# Patient Record
Sex: Male | Born: 1945 | Race: Black or African American | Hispanic: No | Marital: Married | Smoking: Never smoker
Health system: Southern US, Community
[De-identification: ages and names within clinical notes are randomized; demographics above are authoritative.]

## PROBLEM LIST (undated history)

## (undated) DIAGNOSIS — Z8711 Personal history of peptic ulcer disease: Secondary | ICD-10-CM

## (undated) DIAGNOSIS — I639 Cerebral infarction, unspecified: Secondary | ICD-10-CM

## (undated) DIAGNOSIS — J189 Pneumonia, unspecified organism: Secondary | ICD-10-CM

## (undated) DIAGNOSIS — M1990 Unspecified osteoarthritis, unspecified site: Secondary | ICD-10-CM

## (undated) DIAGNOSIS — I739 Peripheral vascular disease, unspecified: Secondary | ICD-10-CM

## (undated) DIAGNOSIS — Z8719 Personal history of other diseases of the digestive system: Secondary | ICD-10-CM

## (undated) DIAGNOSIS — R51 Headache: Secondary | ICD-10-CM

## (undated) DIAGNOSIS — I10 Essential (primary) hypertension: Secondary | ICD-10-CM

## (undated) DIAGNOSIS — E669 Obesity, unspecified: Secondary | ICD-10-CM

## (undated) DIAGNOSIS — E119 Type 2 diabetes mellitus without complications: Secondary | ICD-10-CM

## (undated) DIAGNOSIS — I4891 Unspecified atrial fibrillation: Secondary | ICD-10-CM

## (undated) DIAGNOSIS — E785 Hyperlipidemia, unspecified: Secondary | ICD-10-CM

## (undated) DIAGNOSIS — M545 Low back pain, unspecified: Secondary | ICD-10-CM

## (undated) DIAGNOSIS — K219 Gastro-esophageal reflux disease without esophagitis: Secondary | ICD-10-CM

## (undated) DIAGNOSIS — G8929 Other chronic pain: Secondary | ICD-10-CM

## (undated) HISTORY — PX: EXCISIONAL HEMORRHOIDECTOMY: SHX1541

## (undated) HISTORY — PX: APPENDECTOMY: SHX54

---
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
## 1969-06-24 HISTORY — PX: INCISION AND DRAINAGE OF WOUND: SHX1803

## 2011-10-25 HISTORY — PX: MULTIPLE TOOTH EXTRACTIONS: SHX2053

## 2013-02-26 ENCOUNTER — Telehealth (HOSPITAL_COMMUNITY): Payer: Self-pay | Admitting: *Deleted

## 2013-02-27 ENCOUNTER — Other Ambulatory Visit (HOSPITAL_COMMUNITY): Payer: Self-pay | Admitting: Internal Medicine

## 2013-02-27 DIAGNOSIS — I509 Heart failure, unspecified: Secondary | ICD-10-CM

## 2013-02-27 DIAGNOSIS — R609 Edema, unspecified: Secondary | ICD-10-CM

## 2013-03-03 ENCOUNTER — Emergency Department (HOSPITAL_COMMUNITY): Payer: Medicare Other

## 2013-03-03 ENCOUNTER — Encounter (HOSPITAL_COMMUNITY): Payer: Self-pay | Admitting: *Deleted

## 2013-03-03 ENCOUNTER — Observation Stay (HOSPITAL_COMMUNITY)
Admission: EM | Admit: 2013-03-03 | Discharge: 2013-03-04 | Disposition: A | Discharge status: Home / Self Care | Payer: Medicare Other | Attending: Internal Medicine | Admitting: Internal Medicine

## 2013-03-03 DIAGNOSIS — L02419 Cutaneous abscess of limb, unspecified: Secondary | ICD-10-CM | POA: Diagnosis not present

## 2013-03-03 DIAGNOSIS — E1165 Type 2 diabetes mellitus with hyperglycemia: Secondary | ICD-10-CM | POA: Diagnosis present

## 2013-03-03 DIAGNOSIS — L089 Local infection of the skin and subcutaneous tissue, unspecified: Secondary | ICD-10-CM

## 2013-03-03 DIAGNOSIS — L0390 Cellulitis, unspecified: Secondary | ICD-10-CM

## 2013-03-03 DIAGNOSIS — L0291 Cutaneous abscess, unspecified: Secondary | ICD-10-CM | POA: Diagnosis present

## 2013-03-03 DIAGNOSIS — I10 Essential (primary) hypertension: Secondary | ICD-10-CM | POA: Insufficient documentation

## 2013-03-03 DIAGNOSIS — L97309 Non-pressure chronic ulcer of unspecified ankle with unspecified severity: Secondary | ICD-10-CM

## 2013-03-03 DIAGNOSIS — E119 Type 2 diabetes mellitus without complications: Secondary | ICD-10-CM | POA: Diagnosis not present

## 2013-03-03 DIAGNOSIS — N390 Urinary tract infection, site not specified: Secondary | ICD-10-CM | POA: Insufficient documentation

## 2013-03-03 DIAGNOSIS — E669 Obesity, unspecified: Secondary | ICD-10-CM | POA: Diagnosis present

## 2013-03-03 DIAGNOSIS — T148XXA Other injury of unspecified body region, initial encounter: Secondary | ICD-10-CM

## 2013-03-03 DIAGNOSIS — T798XXA Other early complications of trauma, initial encounter: Secondary | ICD-10-CM

## 2013-03-03 HISTORY — DX: Low back pain, unspecified: M54.50

## 2013-03-03 HISTORY — DX: Low back pain: M54.5

## 2013-03-03 HISTORY — DX: Unspecified atrial fibrillation: I48.91

## 2013-03-03 HISTORY — DX: Personal history of peptic ulcer disease: Z87.11

## 2013-03-03 HISTORY — DX: Peripheral vascular disease, unspecified: I73.9

## 2013-03-03 HISTORY — DX: Essential (primary) hypertension: I10

## 2013-03-03 HISTORY — DX: Unspecified osteoarthritis, unspecified site: M19.90

## 2013-03-03 HISTORY — DX: Pneumonia, unspecified organism: J18.9

## 2013-03-03 HISTORY — DX: Headache: R51

## 2013-03-03 HISTORY — DX: Other chronic pain: G89.29

## 2013-03-03 HISTORY — DX: Obesity, unspecified: E66.9

## 2013-03-03 HISTORY — DX: Type 2 diabetes mellitus without complications: E11.9

## 2013-03-03 HISTORY — DX: Personal history of other diseases of the digestive system: Z87.19

## 2013-03-03 HISTORY — DX: Gastro-esophageal reflux disease without esophagitis: K21.9

## 2013-03-03 LAB — GLUCOSE, CAPILLARY
Glucose-Capillary: 172 mg/dL — ABNORMAL HIGH (ref 70–99)
Glucose-Capillary: 145 mg/dL — ABNORMAL HIGH (ref 70–99)

## 2013-03-03 LAB — BASIC METABOLIC PANEL
Calcium: 9.3 mg/dL (ref 8.4–10.5)
Chloride: 102 mEq/L (ref 96–112)
Creatinine, Ser: 0.81 mg/dL (ref 0.50–1.35)
GFR calc Af Amer: >90 mL/min (ref >90)

## 2013-03-03 LAB — VITAMIN B12: Vitamin B-12: 399 pg/mL (ref 211–911)

## 2013-03-03 LAB — CBC WITH DIFFERENTIAL/PLATELET
Basophils Absolute: 0 10*3/uL (ref 0.0–0.1)
HCT: 39.2 % (ref 39.0–52.0)
Lymphocytes Relative: 45 % (ref 12–46)
Neutro Abs: 3.6 10*3/uL (ref 1.7–7.7)
Platelets: 192 10*3/uL (ref 150–400)
RDW: 14.5 % (ref 11.5–15.5)
WBC: 8.1 10*3/uL (ref 4.0–10.5)

## 2013-03-03 LAB — URINALYSIS, ROUTINE W REFLEX MICROSCOPIC
Bilirubin Urine: NEGATIVE
Ketones, ur: NEGATIVE mg/dL
Nitrite: POSITIVE — AB
Protein, ur: NEGATIVE mg/dL
Urobilinogen, UA: 0.2 mg/dL (ref 0.0–1.0)

## 2013-03-03 LAB — CBC
MCH: 25.3 pg — ABNORMAL LOW (ref 26.0–34.0)
MCHC: 32.5 g/dL (ref 30.0–36.0)
MCV: 77.9 fL — ABNORMAL LOW (ref 78.0–100.0)
Platelets: 167 10*3/uL (ref 150–400)
RDW: 14.3 % (ref 11.5–15.5)

## 2013-03-03 LAB — IRON AND TIBC
Iron: 65 ug/dL (ref 42–135)
Saturation Ratios: 25 % (ref 20–55)
UIBC: 196 ug/dL (ref 125–400)

## 2013-03-03 LAB — URINE MICROSCOPIC-ADD ON

## 2013-03-03 LAB — LACTIC ACID, PLASMA: Lactic Acid, Venous: 1.3 mmol/L (ref 0.5–2.2)

## 2013-03-03 LAB — FOLATE: Folate: 15.3 ng/mL

## 2013-03-03 MED ORDER — INSULIN NPH (HUMAN) (ISOPHANE) 100 UNIT/ML ~~LOC~~ SUSP
10.0000 [IU] | Freq: Every day | SUBCUTANEOUS | Status: DC
  Administered 2013-03-04: 10 [IU] via SUBCUTANEOUS
  Filled 2013-03-03: qty 10

## 2013-03-03 MED ORDER — ONDANSETRON HCL 4 MG/2ML IJ SOLN: 4.0000 mg | Freq: Four times a day (QID) | INTRAMUSCULAR | Status: DC | PRN

## 2013-03-03 MED ORDER — INSULIN NPH (HUMAN) (ISOPHANE) 100 UNIT/ML ~~LOC~~ SUSP
10.0000 [IU] | Freq: Two times a day (BID) | SUBCUTANEOUS | Status: DC
  Filled 2013-03-03: qty 10

## 2013-03-03 MED ORDER — ENOXAPARIN SODIUM 40 MG/0.4ML ~~LOC~~ SOLN: 40.0000 mg | SUBCUTANEOUS | Status: DC

## 2013-03-03 MED ORDER — OXYCODONE HCL 5 MG PO TABS
5.0000 mg | ORAL_TABLET | ORAL | Status: DC | PRN
  Administered 2013-03-03 – 2013-03-04 (×3): 5 mg via ORAL
  Filled 2013-03-03 (×4): qty 1

## 2013-03-03 MED ORDER — INSULIN NPH (HUMAN) (ISOPHANE) 100 UNIT/ML ~~LOC~~ SUSP
10.0000 [IU] | Freq: Every day | SUBCUTANEOUS | Status: DC
  Administered 2013-03-03: 10 [IU] via SUBCUTANEOUS
  Filled 2013-03-03 (×2): qty 10

## 2013-03-03 MED ORDER — ZOLPIDEM TARTRATE 5 MG PO TABS: 5.0000 mg | ORAL_TABLET | Freq: Every evening | ORAL | Status: DC | PRN

## 2013-03-03 MED ORDER — INSULIN ASPART 100 UNIT/ML ~~LOC~~ SOLN
0.0000 [IU] | SUBCUTANEOUS | Status: DC
  Administered 2013-03-03: 2 [IU] via SUBCUTANEOUS

## 2013-03-03 MED ORDER — ENOXAPARIN SODIUM 80 MG/0.8ML ~~LOC~~ SOLN
75.0000 mg | SUBCUTANEOUS | Status: DC
  Administered 2013-03-04: 75 mg via SUBCUTANEOUS
  Filled 2013-03-03 (×2): qty 0.8

## 2013-03-03 MED ORDER — ACETAMINOPHEN 650 MG RE SUPP: 650.0000 mg | Freq: Four times a day (QID) | RECTAL | Status: DC | PRN

## 2013-03-03 MED ORDER — ONDANSETRON HCL 4 MG PO TABS: 4.0000 mg | ORAL_TABLET | Freq: Four times a day (QID) | ORAL | Status: DC | PRN

## 2013-03-03 MED ORDER — PIPERACILLIN-TAZOBACTAM 3.375 G IVPB
3.3750 g | Freq: Three times a day (TID) | INTRAVENOUS | Status: DC
  Administered 2013-03-03 – 2013-03-04 (×4): 3.375 g via INTRAVENOUS
  Filled 2013-03-03 (×7): qty 50

## 2013-03-03 MED ORDER — ALUM & MAG HYDROXIDE-SIMETH 200-200-20 MG/5ML PO SUSP: 30.0000 mL | Freq: Four times a day (QID) | ORAL | Status: DC | PRN

## 2013-03-03 MED ORDER — ACETAMINOPHEN 325 MG PO TABS
650.0000 mg | ORAL_TABLET | Freq: Four times a day (QID) | ORAL | Status: DC | PRN
  Administered 2013-03-03: 650 mg via ORAL
  Filled 2013-03-03: qty 2

## 2013-03-03 MED ORDER — MORPHINE SULFATE 4 MG/ML IJ SOLN
4.0000 mg | Freq: Once | INTRAMUSCULAR | Status: AC
  Administered 2013-03-03: 4 mg via INTRAVENOUS
  Filled 2013-03-03: qty 1

## 2013-03-03 MED ORDER — FENTANYL CITRATE 0.05 MG/ML IJ SOLN: 50.0000 ug | INTRAMUSCULAR | Status: DC | PRN

## 2013-03-03 MED ORDER — PIPERACILLIN-TAZOBACTAM 4.5 G IVPB
4.5000 g | Freq: Once | INTRAVENOUS | Status: AC
  Administered 2013-03-03: 4.5 g via INTRAVENOUS
  Filled 2013-03-03: qty 100

## 2013-03-03 MED ORDER — VANCOMYCIN HCL 10 G IV SOLR
1500.0000 mg | Freq: Two times a day (BID) | INTRAVENOUS | Status: DC
  Administered 2013-03-03 – 2013-03-04 (×3): 1500 mg via INTRAVENOUS
  Filled 2013-03-03 (×6): qty 1500

## 2013-03-03 MED ORDER — HYDROMORPHONE HCL PF 1 MG/ML IJ SOLN
0.5000 mg | INTRAMUSCULAR | Status: DC | PRN
  Filled 2013-03-03: qty 1

## 2013-03-03 MED ORDER — SODIUM CHLORIDE 0.9 % IV SOLN
INTRAVENOUS | Status: DC
  Administered 2013-03-03: 07:00:00 via INTRAVENOUS

## 2013-03-03 MED ORDER — INSULIN NPH (HUMAN) (ISOPHANE) 100 UNIT/ML ~~LOC~~ SUSP
20.0000 [IU] | Freq: Two times a day (BID) | SUBCUTANEOUS | Status: DC
  Filled 2013-03-03: qty 10

## 2013-03-03 MED ORDER — INSULIN ASPART 100 UNIT/ML ~~LOC~~ SOLN
0.0000 [IU] | Freq: Three times a day (TID) | SUBCUTANEOUS | Status: DC
  Administered 2013-03-03 – 2013-03-04 (×2): 2 [IU] via SUBCUTANEOUS
  Administered 2013-03-04: 1 [IU] via SUBCUTANEOUS

## 2013-03-03 MED ORDER — FENTANYL CITRATE 0.05 MG/ML IJ SOLN
25.0000 ug | INTRAMUSCULAR | Status: AC | PRN
  Administered 2013-03-03 (×2): 25 ug via INTRAVENOUS
  Filled 2013-03-03 (×2): qty 2

## 2013-03-03 MED ORDER — VANCOMYCIN HCL IN DEXTROSE 1-5 GM/200ML-% IV SOLN
1000.0000 mg | Freq: Once | INTRAVENOUS | Status: AC
  Administered 2013-03-03: 1000 mg via INTRAVENOUS
  Filled 2013-03-03: qty 200

## 2013-03-03 MED ORDER — MORPHINE SULFATE 4 MG/ML IJ SOLN: 4.0000 mg | INTRAMUSCULAR | Status: DC | PRN

## 2013-03-03 MED ORDER — PIPERACILLIN-TAZOBACTAM 3.375 G IVPB: 3.3750 g | Freq: Four times a day (QID) | INTRAVENOUS | Status: DC

## 2013-03-03 NOTE — Progress Notes (Signed)
Patient stated he received 70units of Novolin insulin twice daily.  However he restated he injected 70/30 insulin 70units twice a day.  Then he stated 70 units in the morning, then 32 units in the afternoon.  When continuing to question patient about checking his CBG's, patient stated he checked them five times and day, then stated he needed a new machine, his was broken.  When nurse continued to question patient about his SSI and how much he was receiving d/t broken meter, patient became very agitated and said there was a lack of communication around here.  Patient then admitted he has not taken any insulin since feb 2014 and we should have all of his records on file.  If not, the [place] in [place].  MD notified of patients non-compliance and contradiction in all of his stories, including his ulcers on his left ankle.  Nsg to continue to monitor for status changes.

## 2013-03-03 NOTE — ED Provider Notes (Signed)
History     CSN: [id number]  Arrival date & time 03/03/13  0208   First MD Initiated Contact with Patient 03/03/13 0214      Chief Complaint  Patient presents with  . Leg Ulcers      HPI Pt was seen at 0230.  Per pt, c/o gradual onset and worsening of persistent left ankle ulcers "on and off since 2009," worse over the past several days.  Pt states he noticed "white stuff" on the ulcers and increasing pain at the ulcerated areas for the past few days.  Pt states he was eval by his PMD 1 week ago for same, "and he gave me a bunch of referrals to other doctors to get checked out."  Denies new injury, no fevers, no focal motor weakness, no tingling/numbness in extremities.     Past Medical History  Diagnosis Date  . Diabetes mellitus without complication   . Hypertension    States he is uncircumcised  History reviewed. No pertinent past surgical history.    History  Substance Use Topics  . Smoking status: Never Smoker   . Smokeless tobacco: Not on file  . Alcohol Use: No      Review of Systems ROS: Statement: All systems negative except as marked or noted in the HPI; Constitutional: Negative for fever and chills. ; ; Eyes: Negative for eye pain, redness and discharge. ; ; ENMT: Negative for ear pain, hoarseness, nasal congestion, sinus pressure and sore throat. ; ; Cardiovascular: Negative for chest pain, palpitations, diaphoresis, dyspnea and peripheral edema. ; ; Respiratory: Negative for cough, wheezing and stridor. ; ; Gastrointestinal: Negative for nausea, vomiting, diarrhea, abdominal pain, blood in stool, hematemesis, jaundice and rectal bleeding. . ; ; Genitourinary: Negative for dysuria, flank pain and hematuria. ; ; Musculoskeletal: Negative for back pain and neck pain. Negative for swelling and trauma.; ; Skin: +left ankle ulcers.  Negative for pruritus, rash, abrasions, blisters, bruising.; ; Neuro: Negative for headache, lightheadedness and neck stiffness. Negative for  weakness, altered level of consciousness , altered mental status, extremity weakness, paresthesias, involuntary movement, seizure and syncope.      Allergies  Ibuprofen  Home Medications   Current Outpatient Rx  Name  Route  Sig  Dispense  Refill  . acetaminophen (TYLENOL) 500 MG tablet   Oral   Take 500 mg by mouth every 6 (six) hours as needed for pain.           BP 145/80  Pulse 57  Temp(Src) 98.5 F (36.9 C) (Oral)  Resp 16  SpO2 97%  Physical Exam 0235: Physical examination:  Nursing notes reviewed; Vital signs and O2 SAT reviewed;  Constitutional: Well developed, Well nourished, Well hydrated, In no acute distress; Head:  Normocephalic, atraumatic; Eyes: EOMI, PERRL, No scleral icterus; ENMT: Mouth and pharynx normal, Mucous membranes moist; Neck: Supple, Full range of motion, No lymphadenopathy; Cardiovascular: Regular rate and rhythm, No murmur, rub, or gallop; Respiratory: Breath sounds clear & equal bilaterally, No rales, rhonchi, wheezes.  Speaking full sentences with ease, Normal respiratory effort/excursion; Chest: Nontender, Movement normal; Abdomen: Soft, Nontender, Nondistended, Normal bowel sounds; Genitourinary: No CVA tenderness; Extremities: Pulses normal, No tenderness, +1 pedal edema bilat without calf asymmetry. +approx 8cm diameter ulcers with large amount of overlying maggots left medial and lateral malleolar areas.; Neuro: AA&Ox3, Major CN grossly intact.  Speech clear. Moves all extremities without apparent gross focal motor deficits.; Skin: Color normal, Warm, Dry.   ED Course  Procedures  MDM  MDM Reviewed: nursing note and vitals Interpretation: labs and x-ray   Results for orders placed during the hospital encounter of 03/03/13  URINALYSIS, ROUTINE W REFLEX MICROSCOPIC      Result Value Range   Color, Urine YELLOW  YELLOW   APPearance CLOUDY (*) CLEAR   Specific Gravity, Urine 1.020  1.005 - 1.030   pH 5.0  5.0 - 8.0   Glucose, UA  NEGATIVE  NEGATIVE mg/dL   Hgb urine dipstick TRACE (*) NEGATIVE   Bilirubin Urine NEGATIVE  NEGATIVE   Ketones, ur NEGATIVE  NEGATIVE mg/dL   Protein, ur NEGATIVE  NEGATIVE mg/dL   Urobilinogen, UA 0.2  0.0 - 1.0 mg/dL   Nitrite POSITIVE (*) NEGATIVE   Leukocytes, UA MODERATE (*) NEGATIVE  BASIC METABOLIC PANEL      Result Value Range   Sodium 138  135 - 145 mEq/L   Potassium 4.2  3.5 - 5.1 mEq/L   Chloride 102  96 - 112 mEq/L   CO2 25  19 - 32 mEq/L   Glucose, Bld 180 (*) 70 - 99 mg/dL   BUN 25 (*) 6 - 23 mg/dL   Creatinine, Ser 1.61  0.50 - 1.35 mg/dL   Calcium 9.3  8.4 - 09.6 mg/dL   GFR calc non Af Amer 90 (*) >90 mL/min   GFR calc Af Amer >90  >90 mL/min  CBC WITH DIFFERENTIAL      Result Value Range   WBC 8.1  4.0 - 10.5 K/uL   RBC 5.00  4.22 - 5.81 MIL/uL   Hemoglobin 12.9 (*) 13.0 - 17.0 g/dL   HCT 04.5  40.9 - 81.1 %   MCV 78.4  78.0 - 100.0 fL   MCH 25.8 (*) 26.0 - 34.0 pg   MCHC 32.9  30.0 - 36.0 g/dL   RDW 91.4  78.2 - 95.6 %   Platelets 192  150 - 400 K/uL   Neutrophils Relative 45  43 - 77 %   Neutro Abs 3.6  1.7 - 7.7 K/uL   Lymphocytes Relative 45  12 - 46 %   Lymphs Abs 3.6  0.7 - 4.0 K/uL   Monocytes Relative 8  3 - 12 %   Monocytes Absolute 0.6  0.1 - 1.0 K/uL   Eosinophils Relative 3  0 - 5 %   Eosinophils Absolute 0.2  0.0 - 0.7 K/uL   Basophils Relative 0  0 - 1 %   Basophils Absolute 0.0  0.0 - 0.1 K/uL  LACTIC ACID, PLASMA      Result Value Range   Lactic Acid, Venous 1.3  0.5 - 2.2 mmol/L  URINE MICROSCOPIC-ADD ON      Result Value Range   Squamous Epithelial / LPF FEW (*) RARE   WBC, UA 11-20  <3 WBC/hpf   RBC / HPF 0-2  <3 RBC/hpf   Bacteria, UA MANY (*) RARE   Dg Ankle Left Port 03/03/2013  *RADIOLOGY REPORT*  Clinical Data: Skin ulcerations.  PORTABLE LEFT ANKLE - 2 VIEW  Comparison: None.  Findings: No bony destructive change, soft tissue gas collection, fracture or dislocation is identified.  No radiopaque foreign body is seen with  skin calcifications noted.  IMPRESSION: No acute bony or joint abnormality.   Original Report Authenticated By: Holley Dexter, M.D.     0330: T/C to Ortho Dr. Lequita Halt, case discussed, including:  HPI, pertinent PM/SHx, VS/PE, dx testing, ED course and treatment:  States to wash maggots off with  NS, cover with NS dressing, have medicine admit team call Ortho MD for consult prn.   0420:  Wounds cleaned with NS: appear down to fascia. +UTI, UC pending. Will start IV vancomycin and zosyn. T/C to [facility] Dr. Lovell Sheehan, case discussed, including:  HPI, pertinent PM/SHx, VS/PE, dx testing, ED course and treatment:  Agreeable to admit, requests to write temporary orders, obtain medical bed to team 10.          Laray Anger, DO 03/05/13 [phone]

## 2013-03-03 NOTE — Progress Notes (Signed)
Patient put on orange contact precautions d/t open wound with maggots. MD aware.

## 2013-03-03 NOTE — Progress Notes (Signed)
ANTIBIOTIC CONSULT NOTE - INITIAL  Pharmacy Consult for Vancomycin Indication: Left ankle Wound Infection    Allergies  Allergen Reactions  . Ibuprofen Other (See Comments)    unknown    Patient Measurements: Height: 6\' 3"  (190.5 cm) Weight: 330 lb (149.687 kg) IBW/kg (Calculated) : 84.5  Vital Signs: Temp: 97.9 F (36.6 C) (05/11 0649) Temp src: Oral (05/11 0649) BP: 137/83 mmHg (05/11 0630) Pulse Rate: 62 (05/11 0630) Intake/Output from previous day:   Intake/Output from this shift:    Labs:  Recent Labs  03/03/13 0300  WBC 8.1  HGB 12.9*  PLT 192  CREATININE 0.81   Estimated Creatinine Clearance: 138.4 ml/min (by C-G formula based on Cr of 0.81). No results found for this basename: VANCOTROUGH, VANCOPEAK, VANCORANDOM, GENTTROUGH, GENTPEAK, GENTRANDOM, TOBRATROUGH, TOBRAPEAK, TOBRARND, AMIKACINPEAK, AMIKACINTROU, AMIKACIN,  in the last 72 hours   Microbiology: No results found for this or any previous visit (from the past 720 hour(s)).  Medical History: Past Medical History  Diagnosis Date  . Diabetes mellitus without complication   . Hypertension   . Obesity    Assessment: 67 y.o. male with Type II Diabetes and a chronic Left ankle Ulcer since 2009 who presents to the ED with pain and drainage from the wound and the wound was found to be infested with maggots. Pharmacy is consulted to start vancomycin. Zosyn is also started per MD. Patient received a dose of vancomycin 1000mg  in the ED at 0510. Scr 0.81, est. crcl > 100 ml/min   Goal of Therapy:  Vancomycin trough level 15-20 mcg/ml  Plan:  - Give vancomycin 1500 mg x 1 to finish 2500 loading dose - Vancomycin 150mg  IV Q 12 start at 2000 - F/u renal function, check vancomycin trough after 3 - 5 doses - Adjust zosyn to 3.375g IV Q 8 hrs (4 hr infusion)  Bayard Hugger, PharmD, BCPS  Clinical Pharmacist  Pager: [phone]   03/03/2013,6:55 AM

## 2013-03-03 NOTE — ED Notes (Addendum)
This RN and Air traffic controller Irrigated pt's foot wounds with NS. Area cleaned, maggots removed. Unable to remove a very small number of maggots. Wet dressing with saline and gauze applied to both wounds. Pt tolerated procedure well. Pt in NAD at this time, c/o 10/10 pain. EDP made aware.

## 2013-03-03 NOTE — Progress Notes (Signed)
Pt refused wound care and dressing change tonight.  He stated that he will not worry about till morning.    Pt was offer by nurse to call if he changes his mind.                    Pt was

## 2013-03-03 NOTE — Consult Note (Signed)
Thayer Headings, MD Chief Complaint: Chronic right ankle ulceration History: Todd Vargas is a 67 y.o. male with Type II Diabetes and a chronic Left ankle Ulcer since 2009 who presents to the ED with complaints of drainage from the wound today and 10/10 pain. In the ED, the wound was found to be infested with maggots. He had been seen in the ED 1 week ago , but the wound was not infested at that time. He was evaluated in the ED and the EDP performed an plain film of there left ankle which was negative for signs of osteomyelitis. IV Vancomycin and Zosyn were started and the EDP called for an Orthopedics Consultation, and spoke to Dr. Despina Hick.   Past Medical History  Diagnosis Date  . Diabetes mellitus without complication   . Hypertension   . Obesity     Allergies  Allergen Reactions  . Ibuprofen Other (See Comments)    unknown    No current facility-administered medications on file prior to encounter.   No current outpatient prescriptions on file prior to encounter.    Physical Exam: Filed Vitals:   03/03/13 0741  BP: 145/96  Pulse: 56  Temp: 98.2 F (36.8 C)  Resp: 18  A+O X3 No sob/cp abd - soft/nt Compartments soft/nt EHL/GA/TA grossly intact Sensation to LT decreased in the foot Decreased 1+ DP/PT pulses Wound: bimalleolar ankle ulcerations.  Superficial no exposed bone.  No active drainage.     Image: Dg Ankle Left Port  03/03/2013  *RADIOLOGY REPORT*  Clinical Data: Skin ulcerations.  PORTABLE LEFT ANKLE - 2 VIEW  Comparison: None.  Findings: No bony destructive change, soft tissue gas collection, fracture or dislocation is identified.  No radiopaque foreign body is seen with skin calcifications noted.  IMPRESSION: No acute bony or joint abnormality.   Original Report Authenticated By: Holley Dexter, M.D.     A/P:  Patient with long standing LE ulceration and wound issues.  Seen this AM in ER with bimalleolar ankle ulceration with maggots.  Wound cleaned and  dressed.  Admitted by medical service and ortho consult requested.  Xrays demonstrate no evidence of osteo.  Patient with cellulitis. Plan:  no acute ortho issue  Recommend medical management of cellulitis  Wound care center for wound management - will need to set up care following discharge.    Also recommend wound care nsg to see patient for further intervention   Will contact medical team.

## 2013-03-03 NOTE — Progress Notes (Signed)
[facility] PROGRESS NOTE  Todd Vargas XBJ:[id number] DOB: 1946-06-06 DOA: 03/03/2013 PCP: Thayer Headings, MD  Assessment/Plan: 1. Left ankle cellulitis and loss of skin integrity - with maggots coming out of the wound. - will get MRI to assure there is no deep abscess. Started on iv vancomycin and Zosyn on admission  2. DM 2 - nph and ssi novolog . A1C pending  3. HTN - probably BP up from pain   Antibiotics:  vanc and zosyn     HPI/Subjective: According to patient the wound is been in place for years. He has moved from [place] 5 months ago  Objective: Filed Vitals:   03/03/13 0600 03/03/13 0630 03/03/13 0649 03/03/13 0741  BP: 147/96 137/83  145/96  Pulse: 68 62  56  Temp:   97.9 F (36.6 C) 98.2 F (36.8 C)  TempSrc:   Oral   Resp:   16 18  Height:  6\' 3"  (1.905 m)    Weight:  149.687 kg (330 lb)    SpO2: 97% 98%  93%   Patient Vitals for the past 24 hrs:  BP Temp Temp src Pulse Resp SpO2 Height Weight  03/03/13 0741 145/96 mmHg 98.2 F (36.8 C) - 56 18 93 % - -  03/03/13 0649 - 97.9 F (36.6 C) Oral - 16 - - -  03/03/13 0630 137/83 mmHg - - 62 - 98 % 6\' 3"  (1.905 m) 149.687 kg (330 lb)  03/03/13 0600 147/96 mmHg - - 68 - 97 % - -  03/03/13 0530 138/87 mmHg - - 64 - 97 % - -  03/03/13 0500 145/80 mmHg - - 57 - 97 % - -  03/03/13 0445 145/90 mmHg - - 75 - 99 % - -  03/03/13 0430 152/86 mmHg - - 56 - 96 % - -  03/03/13 0415 155/89 mmHg - - 62 - 97 % - -  03/03/13 0400 135/102 mmHg - - 58 - 99 % - -  03/03/13 0345 130/102 mmHg - - 65 - 98 % - -  03/03/13 0336 139/84 mmHg 98.5 F (36.9 C) Oral 61 16 98 % - -    No intake or output data in the 24 hours ending 03/03/13 1207 Filed Weights   03/03/13 0630  Weight: 149.687 kg (330 lb)    Exam:   General:  axox3  Cardiovascular: rrr  Respiratory: ctab   Abdomen: soft, nt   Musculoskeletal: left ankle with open wound with maggots   Data Reviewed: Basic Metabolic Panel:  Recent Labs Lab  03/03/13 0300 03/03/13 0655  NA 138  --   K 4.2  --   CL 102  --   CO2 25  --   GLUCOSE 180*  --   BUN 25*  --   CREATININE 0.81 0.68  CALCIUM 9.3  --    Liver Function Tests: No results found for this basename: AST, ALT, ALKPHOS, BILITOT, PROT, ALBUMIN,  in the last 168 hours No results found for this basename: LIPASE, AMYLASE,  in the last 168 hours No results found for this basename: AMMONIA,  in the last 168 hours CBC:  Recent Labs Lab 03/03/13 0300 03/03/13 0655  WBC 8.1 7.3  NEUTROABS 3.6  --   HGB 12.9* 11.7*  HCT 39.2 36.0*  MCV 78.4 77.9*  PLT 192 167   Cardiac Enzymes: No results found for this basename: CKTOTAL, CKMB, CKMBINDEX, TROPONINI,  in the last 168 hours BNP (last 3 results) No results found for  this basename: PROBNP,  in the last 8760 hours CBG:  Recent Labs Lab 03/03/13 0753 03/03/13 1148  GLUCAP 172* 205*    No results found for this or any previous visit (from the past 240 hour(s)).   Studies: Dg Ankle Left Port  03/03/2013  *RADIOLOGY REPORT*  Clinical Data: Skin ulcerations.  PORTABLE LEFT ANKLE - 2 VIEW  Comparison: None.  Findings: No bony destructive change, soft tissue gas collection, fracture or dislocation is identified.  No radiopaque foreign body is seen with skin calcifications noted.  IMPRESSION: No acute bony or joint abnormality.   Original Report Authenticated By: Holley Dexter, M.D.     Scheduled Meds: . enoxaparin (LOVENOX) injection  75 mg Subcutaneous Q24H  . insulin aspart  0-9 Units Subcutaneous Q4H  . insulin NPH  20 Units Subcutaneous BID WC  . piperacillin-tazobactam (ZOSYN)  IV  3.375 g Intravenous Q8H  . vancomycin  1,500 mg Intravenous Q12H   Continuous Infusions: . sodium chloride 75 mL/hr at 03/03/13 0631    Principal Problem:   Cellulitis Active Problems:   Wound infection   Diabetes mellitus type 2, uncontrolled   Hypertension   Morbid obesity   UTI (lower urinary tract  infection)      Taletha Twiford  [facility] Pager [phone]. If 7PM-7AM, please contact night-coverage at [email or web address], password [facility] 03/03/2013, 12:07 PM  LOS: 0 days

## 2013-03-03 NOTE — H&P (Addendum)
[facility] History and Physical  Todd Vargas WGN:[id number] DOB: [birth year] DOA: 03/03/2013  Referring physician:  EDP PCP: Thayer Headings, MD  Specialists:   Chief Complaint:   HPI: Todd Vargas is a 67 y.o. male with Type II Diabetes and a chronic Left ankle Ulcer since 2009 who presents to the ED with complaints of drainage from the wound today and 10/10 pain.   In the ED, the wound was found to be infested with maggots.   He had been seen in the ED 1 week ago , but the wound was not infested at that time.   He was evaluated in the ED and the EDP performed an plain film of there left ankle which was negative for signs of osteomyelitis.  IV Vancomycin and Zosyn were started and the EDP called for an Orthopedics Consultation, and spoke to Dr. Despina Hick.      Review of Systems: The patient denies anorexia, fever, chills, headaches, weight loss, vision loss, decreased hearing, hoarseness, chest pain, syncope, dyspnea on exertion, peripheral edema, balance deficits, hemoptysis, abdominal pain, nausea, vomiting, diarrhea, constipation, hematemesis, melena, hematochezia, severe indigestion/heartburn, hematuria, incontinence, dysuria, muscle weakness, transient blindness, difficulty walking, depression, unusual weight change, abnormal bleeding, enlarged lymph nodes, angioedema, and breast masses.    Past Medical History  Diagnosis Date  . Diabetes mellitus without complication   . Hypertension   . Obesity    Past Surgical History  Procedure Laterality Date  . Tonsil    . Appendectomy    . Right eye      Medications:  HOME MEDS: Prior to Admission medications   Medication Sig Start Date End Date Taking? Authorizing Provider  acetaminophen (TYLENOL) 500 MG tablet Take 500 mg by mouth every 6 (six) hours as needed for pain.   Yes Historical Provider, MD    Allergies:  Allergies  Allergen Reactions  . Ibuprofen Other (See Comments)    unknown    Social History: Lives Alone  reports that he has never smoked. He does not have any smokeless tobacco history on file. He does not drink alcohol or use illicit drugs.   Family History: Family History  Problem Relation Age of Onset  . CAD Father   . CAD Mother   . Diabetes Mother     Physical Exam:  GEN: 64 year old Morbidly Obese African American Male examined  and in no acute distress; cooperative with exam Filed Vitals:   03/03/13 0415 03/03/13 0430 03/03/13 0445 03/03/13 0500  BP: 155/89 152/86 145/90 145/80  Pulse: 62 56 75 57  Temp:      TempSrc:      Resp:      SpO2: 97% 96% 99% 97%   Blood pressure 145/80, pulse 57, temperature 98.5 F (36.9 C), temperature source Oral, resp. rate 16, SpO2 97.00%. PSYCH: He is alert and oriented x4; does not appear anxious does not appear depressed; affect is normal HEENT: Normocephalic and Atraumatic, Mucous membranes pink; PERRLA; EOM intact; Fundi:  Benign;  No scleral icterus, Nares: Patent, Oropharynx: Clear, Fair Dentition, Neck:  FROM, no cervical lymphadenopathy nor thyromegaly or carotid bruit; no JVD; Breasts:: Not examined CHEST WALL: No tenderness CHEST: Normal respiration, clear to auscultation bilaterally HEART: Regular rate and rhythm; no murmurs rubs or gallops BACK: No kyphosis or scoliosis; no CVA tenderness ABDOMEN: Positive Bowel Sounds, Obese, soft non-tender; no masses, no organomegaly, no pannus; no intertriginous candida. Rectal Exam: Not done EXTREMITIES: No cyanosis, clubbing or 2+ EDEMA BLEs,  +Left Medial  lateral Malleolar ulcer annular with 7 cm diameter Genitalia: not examined PULSES: 2+ and symmetric SKIN: Normal hydration no rash or ulceration CNS: Cranial nerves 2-12 grossly intact no focal neurologic deficit   Labs & Imaging Results for orders placed during the hospital encounter of 03/03/13 (from the past 48 hour(s))  BASIC METABOLIC PANEL     Status: Abnormal   Collection Time    03/03/13  3:00 AM      Result Value Range    Sodium 138  135 - 145 mEq/L   Potassium 4.2  3.5 - 5.1 mEq/L   Chloride 102  96 - 112 mEq/L   CO2 25  19 - 32 mEq/L   Glucose, Bld 180 (*) 70 - 99 mg/dL   BUN 25 (*) 6 - 23 mg/dL   Creatinine, Ser 1.61  0.50 - 1.35 mg/dL   Calcium 9.3  8.4 - 09.6 mg/dL   GFR calc non Af Amer 90 (*) >90 mL/min   GFR calc Af Amer >90  >90 mL/min   Comment:            The eGFR has been calculated     using the CKD EPI equation.     This calculation has not been     validated in all clinical     situations.     eGFR's persistently     <90 mL/min signify     possible Chronic Kidney Disease.  CBC WITH DIFFERENTIAL     Status: Abnormal   Collection Time    03/03/13  3:00 AM      Result Value Range   WBC 8.1  4.0 - 10.5 K/uL   RBC 5.00  4.22 - 5.81 MIL/uL   Hemoglobin 12.9 (*) 13.0 - 17.0 g/dL   HCT 04.5  40.9 - 81.1 %   MCV 78.4  78.0 - 100.0 fL   MCH 25.8 (*) 26.0 - 34.0 pg   MCHC 32.9  30.0 - 36.0 g/dL   RDW 91.4  78.2 - 95.6 %   Platelets 192  150 - 400 K/uL   Neutrophils Relative 45  43 - 77 %   Neutro Abs 3.6  1.7 - 7.7 K/uL   Lymphocytes Relative 45  12 - 46 %   Lymphs Abs 3.6  0.7 - 4.0 K/uL   Monocytes Relative 8  3 - 12 %   Monocytes Absolute 0.6  0.1 - 1.0 K/uL   Eosinophils Relative 3  0 - 5 %   Eosinophils Absolute 0.2  0.0 - 0.7 K/uL   Basophils Relative 0  0 - 1 %   Basophils Absolute 0.0  0.0 - 0.1 K/uL  LACTIC ACID, PLASMA     Status: None   Collection Time    03/03/13  3:00 AM      Result Value Range   Lactic Acid, Venous 1.3  0.5 - 2.2 mmol/L  URINALYSIS, ROUTINE W REFLEX MICROSCOPIC     Status: Abnormal   Collection Time    03/03/13  4:04 AM      Result Value Range   Color, Urine YELLOW  YELLOW   APPearance CLOUDY (*) CLEAR   Specific Gravity, Urine 1.020  1.005 - 1.030   pH 5.0  5.0 - 8.0   Glucose, UA NEGATIVE  NEGATIVE mg/dL   Hgb urine dipstick TRACE (*) NEGATIVE   Bilirubin Urine NEGATIVE  NEGATIVE   Ketones, ur NEGATIVE  NEGATIVE mg/dL   Protein, ur  NEGATIVE  NEGATIVE mg/dL  Urobilinogen, UA 0.2  0.0 - 1.0 mg/dL   Nitrite POSITIVE (*) NEGATIVE   Leukocytes, UA MODERATE (*) NEGATIVE  URINE MICROSCOPIC-ADD ON     Status: Abnormal   Collection Time    03/03/13  4:04 AM      Result Value Range   Squamous Epithelial / LPF FEW (*) RARE   WBC, UA 11-20  <3 WBC/hpf   RBC / HPF 0-2  <3 RBC/hpf   Bacteria, UA MANY (*) RARE     Radiological Exams on Admission: Dg Ankle Left Port  03/03/2013  *RADIOLOGY REPORT*  Clinical Data: Skin ulcerations.  PORTABLE LEFT ANKLE - 2 VIEW  Comparison: None.  Findings: No bony destructive change, soft tissue gas collection, fracture or dislocation is identified.  No radiopaque foreign body is seen with skin calcifications noted.  IMPRESSION: No acute bony or joint abnormality.   Original Report Authenticated By: Holley Dexter, M.D.       Assessment/Plan Principal Problem:   Cellulitis Active Problems:   Wound infection   Diabetes mellitus type 2, uncontrolled   Hypertension   Morbid obesity   UTI (lower urinary tract infection)    1.  Cellulitis/Wound Infection of Left Medial Ankle-   IV Vancomycin and Zosyn. No signs of Osteomyelitis on plain film,   Orthopedics consulted by EDP.   Wound Care Per Ortho.     2.  Diabetis type II-  No Medications , Check HbA1C,  SSI coverage PRn elevated Glucose levels.    3.  HTN- Monitor BPs.  4.  UTI-   Urine C+S sent,  Covered by Zosyn.    5.  Morbid Obesity- Stable.       Code Status:  FULL CODE Family Communication:  No Family Present at Bedside Disposition Plan:  TBA  Time spent: 75 Minutes  Ron Parker [facility] Pager [phone]  If 7PM-7AM, please contact night-coverage [email or web address] Password [facility] 03/03/2013, 6:26 AM

## 2013-03-03 NOTE — ED Notes (Signed)
Pt in via EMS, per EMS pt in with a leg ulcer to his left ankle, significant maggots noted in wound on all sides of ankle. Pt was seen last week for ulcer and he did not have maggots at that time. Pt first noted this tonight, states he woke up and his leg was hurting more and he was "white stuff" coming out of it.

## 2013-03-04 ENCOUNTER — Encounter (HOSPITAL_COMMUNITY): Payer: Self-pay | Admitting: General Practice

## 2013-03-04 ENCOUNTER — Observation Stay (HOSPITAL_COMMUNITY): Payer: Medicare Other

## 2013-03-04 DIAGNOSIS — L03119 Cellulitis of unspecified part of limb: Secondary | ICD-10-CM | POA: Diagnosis not present

## 2013-03-04 LAB — GLUCOSE, CAPILLARY
Glucose-Capillary: 153 mg/dL — ABNORMAL HIGH (ref 70–99)
Glucose-Capillary: 146 mg/dL — ABNORMAL HIGH (ref 70–99)

## 2013-03-04 LAB — BASIC METABOLIC PANEL
CO2: 25 mEq/L (ref 19–32)
Calcium: 8.5 mg/dL (ref 8.4–10.5)
GFR calc Af Amer: >90 mL/min (ref >90)
Sodium: 137 mEq/L (ref 135–145)

## 2013-03-04 LAB — CBC
MCV: 78.7 fL (ref 78.0–100.0)
Platelets: 164 10*3/uL (ref 150–400)
RBC: 4.47 MIL/uL (ref 4.22–5.81)
WBC: 6.4 10*3/uL (ref 4.0–10.5)

## 2013-03-04 MED ORDER — GADOBENATE DIMEGLUMINE 529 MG/ML IV SOLN
20.0000 mL | Freq: Once | INTRAVENOUS | Status: AC
  Administered 2013-03-04: 20 mL via INTRAVENOUS

## 2013-03-04 MED ORDER — INSULIN NPH ISOPHANE & REGULAR (70-30) 100 UNIT/ML ~~LOC~~ SUSP: 10.0000 [IU] | Freq: Two times a day (BID) | SUBCUTANEOUS | Status: DC

## 2013-03-04 MED ORDER — PNEUMOCOCCAL VAC POLYVALENT 25 MCG/0.5ML IJ INJ: 0.5000 mL | INJECTION | INTRAMUSCULAR | Status: DC

## 2013-03-04 MED ORDER — AMOXICILLIN-POT CLAVULANATE 875-125 MG PO TABS: 1.0000 | ORAL_TABLET | Freq: Two times a day (BID) | ORAL | Status: DC

## 2013-03-04 NOTE — Consult Note (Signed)
WOC consult Note Reason for Consult: Consult requested for right leg wounds.  Chronic since 2009 according to H&P.  [facility] but [facility] have not improved.  Pt has been treating wound at home and states he treats topically with butter and olive oil. Wound type: Full thickness wound to lateral and medial aspect of R ankle. Pressure Ulcer POA: NOT pressure ulcers Measurement: Lateral wound: 6 X 5X.2cm  Medial wound 6 X 4X.2cm Wound bed: Lateral wound: Many wiggly white maggots active in wound bed, 75% red, 25% yellow. Moderate amount of yellowish drainage.  Medial wound: Few white maggots at surface of wound and tunneling deeper when dressing removed, 50% red 50% dry scabbed area, small amount of yellow drainage. Drainage (amount, consistency, odor): No odor present. Periwound: Intact Dressing procedure/placement/frequency:  Wounds cleaned with soap and water.  Aquacel placed over wound to wick away moisture, provide antimicrobial benifits and promote healing.  Wrapped with kerlex.  Discussed with patient that butter and olive oil is not an appropriate treatment or evidence-based for these wounds.  Pt is currently receiving IV Vancomycin and Zosyn. Norva Karvonen RN, MSN student Please re-consult if further assistance is needed.  Thank-you,  Cammie Mcgee MSN, RN, CWOCN, [place], CNS [phone]

## 2013-03-04 NOTE — Discharge Summary (Signed)
Physician Discharge Summary  Todd Vargas:[id number] DOB: 1946/05/03 DOA: 03/03/2013  PCP: Thayer Headings, MD  Admit date: 03/03/2013 Discharge date: 03/04/2013  Time spent: 45 minutes  Recommendations for Outpatient Follow-up:  Wound care - [atient set up with home health and with wound care clinic   Discharge Diagnoses:     Cellulitis   Wound infestation with maggots    Diabetes mellitus type 2, uncontrolled   Hypertension   Morbid obesity  GNR  UTI (lower urinary tract infection)   Discharge Condition: fair  Diet recommendation: diabetic   Filed Weights   03/03/13 0630  Weight: 149.687 kg (330 lb)    History of present illness:  Todd Vargas is a 67 y.o. male with Type II Diabetes and a chronic Left ankle Ulcer since 2009 who presents to the ED with complaints of drainage from the wound today and 10/10 pain. In the ED, the wound was found to be infested with maggots. He had been seen in the ED 1 week ago , but the wound was not infested at that time. He was evaluated in the ED and the EDP performed an plain film of there left ankle which was negative for signs of osteomyelitis. IV Vancomycin and Zosyn were started and the EDP called for an Orthopedics Consultation, and spoke to Dr. Despina Hick.      Hospital Course:  1. Leg wound - MRI negative for deep abscess. Culture pending. Treated empirically with iv Vanc and Zosyn - seen by orthopedic specialist and wound care specialist - no debridement necessary - plan for local care and f/u at wound care clinic  2. UTi changed abx to augmentin po  3. DM type 2 - controlled - decreased amount of 70/30 to prevent hypoglycemia    Procedures: MR ankle   Consultations:  Wound care   Discharge Exam: Filed Vitals:   03/03/13 1659 03/03/13 2147 03/04/13 0241 03/04/13 0557  BP: 148/78 139/102 128/73 127/68  Pulse: 91 45 102 74  Temp: 97.6 F (36.4 C) 99.1 F (37.3 C) 98.8 F (37.1 C) 98.6 F (37 C)  TempSrc:      Resp: 16  16 16 18   Height:      Weight:      SpO2: 98% 97% 99% 99%    General: axox3 Cardiovascular: rrr Respiratory: ctab   Discharge Instructions  Discharge Orders   Future Appointments Provider Department Dept Phone   03/06/2013 1:00 PM [facility]m [facility] [address] [phone]   Future Orders Complete By Expires     Diet Carb Modified  As directed     Increase activity slowly  As directed         Medication List    TAKE these medications       acetaminophen 500 MG tablet  Commonly known as:  TYLENOL  Take 500 mg by mouth every 6 (six) hours as needed for pain.     amoxicillin-clavulanate 875-125 MG per tablet  Commonly known as:  AUGMENTIN  Take 1 tablet by mouth 2 (two) times daily.     insulin NPH-regular (70-30) 100 UNIT/ML injection  Commonly known as:  NOVOLIN 70/30  Inject 10 Units into the skin 2 (two) times daily with a meal.       Allergies  Allergen Reactions  . Ibuprofen Other (See Comments)    unknown       Follow-up Information   Schedule an appointment as soon as possible for a visit with  Thayer Headings, MD.   Contact information:   [address] Todd Vargas [address] [place] [address] [phone]        The results of significant diagnostics from this hospitalization (including imaging, microbiology, ancillary and laboratory) are listed below for reference.    Significant Diagnostic Studies: Mr Ankle Left W Wo Contrast  03/04/2013  * RADIOLOGY REPORT *  Clinical Data:  Soft tissue cellulitis medial and lateral soft tissue ulcerations.  MRI OF THE LEFT ANKLE WITHOUT AND WITH CONTRAST  Technique: Multiplanar, multisequence MR imaging of the left/right ankle was performed before and after the administration of intravenous contrast.  Comparison:  Radiographs dated 03/03/2013  Contrast: 20mL MULTIHANCE GADOBENATE DIMEGLUMINE 529 MG/ML IV SOLN  FINDINGS: There is superficial soft tissue edema in the subcutaneous  fat of the medial and lateral aspects of the ankle. There is no abscess or deep space infection.  TENDONS Peroneal: Normal. Posteromedial:  Normal. Anterior: Normal. Achilles: Normal. Plantar Fascia: Minimal degenerative changes of the origin of the medial band.  No active plantar fasciitis.  LIGAMENTS Lateral: Normal. Medial: Normal.  CARTILAGE Ankle Joint: Slight thinning of the articular cartilage of the lateral aspect of the talar dome.  No joint effusion or osteophytes. Subtalar Joints/Sinus Tarsi: Tiny focal area of grade 4 chondromalacia of the posterior facet of the talus. Bones: No osteomyelitis or significant arthritis.  IMPRESSION:  1.  Superficial cellulitis of the medial and lateral aspects of the ankle. 2.  No osteomyelitis or abscess or septic joint.   Original Report Authenticated By: Francene Boyers, M.D.    Dg Ankle Left Port  03/03/2013  *RADIOLOGY REPORT*  Clinical Data: Skin ulcerations.  PORTABLE LEFT ANKLE - 2 VIEW  Comparison: None.  Findings: No bony destructive change, soft tissue gas collection, fracture or dislocation is identified.  No radiopaque foreign body is seen with skin calcifications noted.  IMPRESSION: No acute bony or joint abnormality.   Original Report Authenticated By: Holley Dexter, M.D.     Microbiology: Recent Results (from the past 240 hour(s))  URINE CULTURE     Status: None   Collection Time    03/03/13  4:04 AM      Result Value Range Status   Specimen Description URINE, CLEAN CATCH   Final   Special Requests NONE   Final   Culture  Setup Time 03/03/2013 04:30   Final   Colony Count >=100,000 COLONIES/ML   Final   Culture GRAM NEGATIVE RODS   Final   Report Status PENDING   Incomplete  WOUND CULTURE     Status: None   Collection Time    03/03/13  4:08 AM      Result Value Range Status   Specimen Description WOUND LEFT ANKLE RIGHT SIDE   Final   Special Requests NONE   Final   Gram Stain PENDING   Incomplete   Culture Culture reincubated for  better growth   Final   Report Status PENDING   Incomplete  WOUND CULTURE     Status: None   Collection Time    03/03/13  4:08 AM      Result Value Range Status   Specimen Description WOUND   Final   Special Requests WOUND LEFT ANKLE LEFT SIDE   Final   Gram Stain PENDING   Incomplete   Culture Culture reincubated for better growth   Final   Report Status PENDING   Incomplete     Labs: Basic Metabolic Panel:  Recent Labs Lab 03/03/13 0300 03/03/13  3664 03/04/13 0500  NA 138  --  137  K 4.2  --  4.1  CL 102  --  106  CO2 25  --  25  GLUCOSE 180*  --  154*  BUN 25*  --  14  CREATININE 0.81 0.68 0.74  CALCIUM 9.3  --  8.5   Liver Function Tests: No results found for this basename: AST, ALT, ALKPHOS, BILITOT, PROT, ALBUMIN,  in the last 168 hours No results found for this basename: LIPASE, AMYLASE,  in the last 168 hours No results found for this basename: AMMONIA,  in the last 168 hours CBC:  Recent Labs Lab 03/03/13 0300 03/03/13 0655 03/04/13 0500  WBC 8.1 7.3 6.4  NEUTROABS 3.6  --   --   HGB 12.9* 11.7* 11.7*  HCT 39.2 36.0* 35.2*  MCV 78.4 77.9* 78.7  PLT 192 167 164   Cardiac Enzymes: No results found for this basename: CKTOTAL, CKMB, CKMBINDEX, TROPONINI,  in the last 168 hours BNP: BNP (last 3 results) No results found for this basename: PROBNP,  in the last 8760 hours CBG:  Recent Labs Lab 03/03/13 1148 03/03/13 1652 03/03/13 2213 03/04/13 0649 03/04/13 1131  GLUCAP 205* 179* 145* 153* 146*       Signed:  Renley Banwart  [facility] 03/04/2013, 11:53 AM

## 2013-03-04 NOTE — Care Management Note (Signed)
CARE MANAGEMENT NOTE 03/04/2013  Patient:  Todd Vargas, Todd Vargas   Account Number:  [id number]  Date Initiated:  03/04/2013  Documentation initiated by:  Vance Peper  Subjective/Objective Assessment:   67 yr old male admitted for diabetic Ulcers on left ankle     Action/Plan:   CM arrangedappointment with Wound Center for 03/20/13 @ 9:45am. Patient will receive HH from [facility] until the appointment date. CM spooke with Dr. Lavera Guise.   Anticipated DC Date:  03/04/2013   Anticipated DC Plan:  HOME W HOME HEALTH SERVICES      DC Planning Services  CM consult  Follow-up appt scheduled      [facility] Choice  HOME HEALTH   Choice offered to / List presented to:  C-1 Patient        HH arranged  HH-1 RN      [facility] agency  [facility]   Status of service:  Completed, signed off Medicare Important Message given?   (If response is "NO", the following Medicare IM given date fields will be blank) Date Medicare IM given:   Date Additional Medicare IM given:    Discharge Disposition:  HOME W HOME HEALTH SERVICES  Per UR Regulation:    If discussed at Long Length of Stay Meetings, dates discussed:    Comments:

## 2013-03-05 LAB — URINE CULTURE

## 2013-03-05 LAB — WOUND CULTURE

## 2013-03-06 ENCOUNTER — Ambulatory Visit (HOSPITAL_COMMUNITY): Payer: Self-pay

## 2013-03-06 LAB — WOUND CULTURE

## 2013-03-20 ENCOUNTER — Encounter (HOSPITAL_BASED_OUTPATIENT_CLINIC_OR_DEPARTMENT_OTHER): Payer: Medicare Other | Attending: General Surgery

## 2013-03-28 ENCOUNTER — Ambulatory Visit (HOSPITAL_COMMUNITY)
Admission: RE | Admit: 2013-03-28 | Discharge: 2013-03-28 | Disposition: A | Discharge status: Home / Self Care | Payer: Medicare Other | Source: Ambulatory Visit | Attending: Internal Medicine | Admitting: Internal Medicine

## 2013-03-28 ENCOUNTER — Encounter (HOSPITAL_BASED_OUTPATIENT_CLINIC_OR_DEPARTMENT_OTHER): Payer: Medicare Other | Attending: Internal Medicine

## 2013-03-28 ENCOUNTER — Other Ambulatory Visit (HOSPITAL_COMMUNITY): Payer: Self-pay | Admitting: Internal Medicine

## 2013-03-28 DIAGNOSIS — Z794 Long term (current) use of insulin: Secondary | ICD-10-CM | POA: Insufficient documentation

## 2013-03-28 DIAGNOSIS — I4891 Unspecified atrial fibrillation: Secondary | ICD-10-CM | POA: Insufficient documentation

## 2013-03-28 DIAGNOSIS — I080 Rheumatic disorders of both mitral and aortic valves: Secondary | ICD-10-CM | POA: Insufficient documentation

## 2013-03-28 DIAGNOSIS — I10 Essential (primary) hypertension: Secondary | ICD-10-CM | POA: Insufficient documentation

## 2013-03-28 DIAGNOSIS — M7989 Other specified soft tissue disorders: Secondary | ICD-10-CM

## 2013-03-28 DIAGNOSIS — Z7982 Long term (current) use of aspirin: Secondary | ICD-10-CM | POA: Insufficient documentation

## 2013-03-28 DIAGNOSIS — L97309 Non-pressure chronic ulcer of unspecified ankle with unspecified severity: Secondary | ICD-10-CM | POA: Insufficient documentation

## 2013-03-28 DIAGNOSIS — I509 Heart failure, unspecified: Secondary | ICD-10-CM

## 2013-03-28 DIAGNOSIS — Z87891 Personal history of nicotine dependence: Secondary | ICD-10-CM | POA: Insufficient documentation

## 2013-03-28 DIAGNOSIS — R609 Edema, unspecified: Secondary | ICD-10-CM

## 2013-03-28 DIAGNOSIS — I079 Rheumatic tricuspid valve disease, unspecified: Secondary | ICD-10-CM | POA: Insufficient documentation

## 2013-03-28 DIAGNOSIS — I872 Venous insufficiency (chronic) (peripheral): Secondary | ICD-10-CM | POA: Insufficient documentation

## 2013-03-28 DIAGNOSIS — E1149 Type 2 diabetes mellitus with other diabetic neurological complication: Secondary | ICD-10-CM | POA: Insufficient documentation

## 2013-03-28 DIAGNOSIS — Z79899 Other long term (current) drug therapy: Secondary | ICD-10-CM | POA: Insufficient documentation

## 2013-03-28 DIAGNOSIS — I379 Nonrheumatic pulmonary valve disorder, unspecified: Secondary | ICD-10-CM | POA: Insufficient documentation

## 2013-03-28 DIAGNOSIS — E1142 Type 2 diabetes mellitus with diabetic polyneuropathy: Secondary | ICD-10-CM | POA: Insufficient documentation

## 2013-03-28 DIAGNOSIS — E669 Obesity, unspecified: Secondary | ICD-10-CM | POA: Insufficient documentation

## 2013-03-28 DIAGNOSIS — L97509 Non-pressure chronic ulcer of other part of unspecified foot with unspecified severity: Secondary | ICD-10-CM | POA: Insufficient documentation

## 2013-03-28 DIAGNOSIS — E119 Type 2 diabetes mellitus without complications: Secondary | ICD-10-CM | POA: Insufficient documentation

## 2013-03-28 NOTE — Progress Notes (Signed)
[facility]   2D echo completed 03/28/2013.   Cindy Shyheim Tanney, RDCS  

## 2013-03-29 NOTE — Progress Notes (Signed)
[facility]  NAMESTARLIN, Todd Vargas                  ACCOUNT NO.:  [id number]  MEDICAL RECORD NO.:  [id number]      DATE OF BIRTH:  [birth year]  PHYSICIAN:  Maxwell Caul, M.D. VISIT DATE:  03/28/2013                                  OFFICE VISIT   CHIEF COMPLAINT:  Review of wounds on his left lower leg.  HISTORY:  Todd Vargas is a 67 year old man who is a type 2 diabetic who has had a chronic left ankle ulcer since 2009, although he tells me that this version of it has only been present for 2 to 3 months.  In any case, he was an inpatient from Mar 03, 2013, through March 28, 2013, with a wound infection in this area with an infestation of maggots.  An MRI was negative for deep abscess.  He was treated with vancomycin and Zosyn.  He was, I believe, seen by Orthopedic Surgery.  Debridement was not felt to be necessary.  PAST MEDICAL HISTORY:  Obesity, hypertension, type 2 diabetes, cellulitis, and an appendectomy.  MEDICATIONS:  Medication list is reviewed.  He is on Novolin 70/30 insulin, 62 in the morning, 32 in the evening; 20 mg of Lasix; and aspirin 81 mg.  PHYSICAL EXAMINATION:  VITAL SIGNS:  Temperature is 98.1, pulse 69, respirations 14, blood pressure 181/93.  He is 280 pounds. RESPIRATORY:  Shallow air entry bilaterally, but no crackles or wheezes. CARDIAC:  Heart sounds are normal.  No signs of congestive heart failure. EXTREMITIES:  Left leg involving is foot up until just into the lower leg, there are extensive areas what appears to be inflammation from chronic venous stasis.  The epithelial layers denuded.  The wounds in question are on the medial left ankle and the lateral left foot.  There is extensive areas of breakdown here, however, the base of this appears to be granulating.  I did not debride this, nor did I feel there was any need for cultures at this point.  No additional antibiotics were prescribed.  He has a good active dorsalis pedis  pulse.  Capillary refill time seemed normal.  ABI in this clinic was 1.27.  IMPRESSIONS: 1. Severe venous stasis with severe venous stasis dermatitis and     resultant ulcerations.  I do not think there is significant     peripheral arterial disease.  We dressed these wounds with silver     alginate and a Profore Lite wrap that he will keep on all week.  I     will review this again in a week's time. 2. Diabetic neuropathy.  He had reduced light touch, reduced vibration     sense. 3. Wounds were dressed with silver alginate and a Profore Lite     compression.  I do not think he has significant peripheral arterial     disease, but certainly he does have significant stasis dermatitis.     We will see him again next week.          ______________________________ Maxwell Caul, M.D.     MGR/MEDQ  D:  03/28/2013  T:  03/29/2013  Job:  [id number]

## 2013-04-30 ENCOUNTER — Encounter (HOSPITAL_BASED_OUTPATIENT_CLINIC_OR_DEPARTMENT_OTHER): Payer: Medicare Other | Attending: General Surgery

## 2013-04-30 DIAGNOSIS — I87319 Chronic venous hypertension (idiopathic) with ulcer of unspecified lower extremity: Secondary | ICD-10-CM | POA: Insufficient documentation

## 2013-04-30 DIAGNOSIS — E119 Type 2 diabetes mellitus without complications: Secondary | ICD-10-CM | POA: Insufficient documentation

## 2013-04-30 DIAGNOSIS — L97909 Non-pressure chronic ulcer of unspecified part of unspecified lower leg with unspecified severity: Secondary | ICD-10-CM | POA: Insufficient documentation

## 2013-05-30 ENCOUNTER — Encounter (HOSPITAL_BASED_OUTPATIENT_CLINIC_OR_DEPARTMENT_OTHER): Payer: Medicare Other | Attending: Internal Medicine

## 2013-05-30 DIAGNOSIS — E119 Type 2 diabetes mellitus without complications: Secondary | ICD-10-CM | POA: Insufficient documentation

## 2013-05-30 DIAGNOSIS — L97909 Non-pressure chronic ulcer of unspecified part of unspecified lower leg with unspecified severity: Secondary | ICD-10-CM | POA: Insufficient documentation

## 2013-05-30 DIAGNOSIS — I87319 Chronic venous hypertension (idiopathic) with ulcer of unspecified lower extremity: Secondary | ICD-10-CM | POA: Insufficient documentation

## 2013-11-18 ENCOUNTER — Inpatient Hospital Stay (HOSPITAL_COMMUNITY)
Admission: EM | Admit: 2013-11-18 | Discharge: 2013-11-30 | DRG: 064 | Disposition: A | Discharge status: Skilled Nursing Facility | Payer: Medicare Other | Attending: Neurology | Admitting: Neurology

## 2013-11-18 ENCOUNTER — Inpatient Hospital Stay (HOSPITAL_COMMUNITY): Payer: Medicare Other

## 2013-11-18 ENCOUNTER — Other Ambulatory Visit: Payer: Self-pay

## 2013-11-18 ENCOUNTER — Emergency Department (HOSPITAL_COMMUNITY): Payer: Medicare Other

## 2013-11-18 ENCOUNTER — Encounter (HOSPITAL_COMMUNITY): Payer: Self-pay | Admitting: Emergency Medicine

## 2013-11-18 DIAGNOSIS — E669 Obesity, unspecified: Secondary | ICD-10-CM

## 2013-11-18 DIAGNOSIS — J9600 Acute respiratory failure, unspecified whether with hypoxia or hypercapnia: Secondary | ICD-10-CM | POA: Diagnosis present

## 2013-11-18 DIAGNOSIS — E876 Hypokalemia: Secondary | ICD-10-CM | POA: Diagnosis not present

## 2013-11-18 DIAGNOSIS — I10 Essential (primary) hypertension: Secondary | ICD-10-CM | POA: Diagnosis present

## 2013-11-18 DIAGNOSIS — D649 Anemia, unspecified: Secondary | ICD-10-CM | POA: Diagnosis not present

## 2013-11-18 DIAGNOSIS — Z66 Do not resuscitate: Secondary | ICD-10-CM | POA: Diagnosis not present

## 2013-11-18 DIAGNOSIS — G936 Cerebral edema: Secondary | ICD-10-CM | POA: Diagnosis not present

## 2013-11-18 DIAGNOSIS — R32 Unspecified urinary incontinence: Secondary | ICD-10-CM | POA: Diagnosis present

## 2013-11-18 DIAGNOSIS — I4891 Unspecified atrial fibrillation: Secondary | ICD-10-CM | POA: Diagnosis not present

## 2013-11-18 DIAGNOSIS — I639 Cerebral infarction, unspecified: Secondary | ICD-10-CM | POA: Diagnosis present

## 2013-11-18 DIAGNOSIS — R159 Full incontinence of feces: Secondary | ICD-10-CM | POA: Diagnosis present

## 2013-11-18 DIAGNOSIS — G8190 Hemiplegia, unspecified affecting unspecified side: Secondary | ICD-10-CM | POA: Diagnosis present

## 2013-11-18 DIAGNOSIS — Z6836 Body mass index (BMI) 36.0-36.9, adult: Secondary | ICD-10-CM

## 2013-11-18 DIAGNOSIS — I6350 Cerebral infarction due to unspecified occlusion or stenosis of unspecified cerebral artery: Secondary | ICD-10-CM

## 2013-11-18 DIAGNOSIS — I63239 Cerebral infarction due to unspecified occlusion or stenosis of unspecified carotid arteries: Principal | ICD-10-CM | POA: Diagnosis present

## 2013-11-18 DIAGNOSIS — E870 Hyperosmolality and hypernatremia: Secondary | ICD-10-CM | POA: Diagnosis not present

## 2013-11-18 DIAGNOSIS — E119 Type 2 diabetes mellitus without complications: Secondary | ICD-10-CM

## 2013-11-18 DIAGNOSIS — R1310 Dysphagia, unspecified: Secondary | ICD-10-CM | POA: Diagnosis present

## 2013-11-18 DIAGNOSIS — M25569 Pain in unspecified knee: Secondary | ICD-10-CM | POA: Diagnosis not present

## 2013-11-18 DIAGNOSIS — R471 Dysarthria and anarthria: Secondary | ICD-10-CM | POA: Diagnosis present

## 2013-11-18 DIAGNOSIS — M25561 Pain in right knee: Secondary | ICD-10-CM | POA: Diagnosis not present

## 2013-11-18 DIAGNOSIS — G935 Compression of brain: Secondary | ICD-10-CM | POA: Diagnosis not present

## 2013-11-18 DIAGNOSIS — I428 Other cardiomyopathies: Secondary | ICD-10-CM | POA: Diagnosis present

## 2013-11-18 DIAGNOSIS — E785 Hyperlipidemia, unspecified: Secondary | ICD-10-CM | POA: Diagnosis present

## 2013-11-18 DIAGNOSIS — G9340 Encephalopathy, unspecified: Secondary | ICD-10-CM | POA: Diagnosis present

## 2013-11-18 HISTORY — DX: Essential (primary) hypertension: I10

## 2013-11-18 HISTORY — DX: Hyperlipidemia, unspecified: E78.5

## 2013-11-18 LAB — COMPREHENSIVE METABOLIC PANEL
ALT: 17 U/L (ref 0–53)
AST: 20 U/L (ref 0–37)
Albumin: 4 g/dL (ref 3.5–5.2)
Alkaline Phosphatase: 136 U/L — ABNORMAL HIGH (ref 39–117)
BUN: 7 mg/dL (ref 6–23)
CALCIUM: 8.8 mg/dL (ref 8.4–10.5)
CO2: 21 mEq/L (ref 19–32)
Chloride: 101 mEq/L (ref 96–112)
Creatinine, Ser: 0.49 mg/dL — ABNORMAL LOW (ref 0.50–1.35)
GFR calc Af Amer: 76 mL/min — ABNORMAL LOW (ref >90)
GFR calc non Af Amer: 66 mL/min — ABNORMAL LOW (ref >90)
GLUCOSE: 180 mg/dL — AB (ref 70–99)
Potassium: 4.2 mEq/L (ref 3.7–5.3)
Sodium: 138 mEq/L (ref 137–147)
TOTAL PROTEIN: 7.7 g/dL (ref 6.0–8.3)
Total Bilirubin: 0.4 mg/dL (ref 0.3–1.2)

## 2013-11-18 LAB — CBC WITH DIFFERENTIAL/PLATELET
BASOS PCT: 0 % (ref 0–1)
Basophils Absolute: 0 10*3/uL (ref 0.0–0.1)
EOS ABS: 0 10*3/uL (ref 0.0–0.7)
EOS PCT: 0 % (ref 0–5)
HCT: 40.6 % (ref 39.0–52.0)
HEMOGLOBIN: 13.3 g/dL (ref 13.0–17.0)
LYMPHS ABS: 1.5 10*3/uL (ref 0.7–4.0)
Lymphocytes Relative: 20 % (ref 12–46)
MCH: 25.5 pg — AB (ref 26.0–34.0)
MCHC: 32.8 g/dL (ref 30.0–36.0)
MCV: 77.9 fL — AB (ref 78.0–100.0)
MONOS PCT: 4 % (ref 3–12)
Monocytes Absolute: 0.3 10*3/uL (ref 0.1–1.0)
Neutro Abs: 5.5 10*3/uL (ref 1.7–7.7)
Neutrophils Relative %: 75 % (ref 43–77)
Platelets: 154 10*3/uL (ref 150–400)
RBC: 5.21 MIL/uL (ref 4.22–5.81)
RDW: 14.9 % (ref 11.5–15.5)
WBC: 7.3 10*3/uL (ref 4.0–10.5)

## 2013-11-18 LAB — CBC
HEMATOCRIT: 38.8 % — AB (ref 39.0–52.0)
Hemoglobin: 12.7 g/dL — ABNORMAL LOW (ref 13.0–17.0)
MCH: 25.6 pg — AB (ref 26.0–34.0)
MCHC: 32.7 g/dL (ref 30.0–36.0)
MCV: 78.2 fL (ref 78.0–100.0)
PLATELETS: 179 10*3/uL (ref 150–400)
RBC: 4.96 MIL/uL (ref 4.22–5.81)
RDW: 15 % (ref 11.5–15.5)
WBC: 8.7 10*3/uL (ref 4.0–10.5)

## 2013-11-18 LAB — LACTIC ACID, PLASMA: Lactic Acid, Venous: 3.2 mmol/L — ABNORMAL HIGH (ref 0.5–2.2)

## 2013-11-18 LAB — ETHANOL

## 2013-11-18 LAB — GLUCOSE, CAPILLARY
GLUCOSE-CAPILLARY: 128 mg/dL — AB (ref 70–99)
Glucose-Capillary: 143 mg/dL — ABNORMAL HIGH (ref 70–99)
Glucose-Capillary: 138 mg/dL — ABNORMAL HIGH (ref 70–99)

## 2013-11-18 LAB — PROTIME-INR
INR: 1.01 (ref 0.00–1.49)
PROTHROMBIN TIME: 13.1 s (ref 11.6–15.2)

## 2013-11-18 LAB — POCT I-STAT, CHEM 8
BUN: 5 mg/dL — AB (ref 6–23)
CALCIUM ION: 1.16 mmol/L (ref 1.13–1.30)
Chloride: 103 mEq/L (ref 96–112)
Creatinine, Ser: 0.6 mg/dL (ref 0.50–1.35)
Glucose, Bld: 183 mg/dL — ABNORMAL HIGH (ref 70–99)
HCT: 45 % (ref 39.0–52.0)
Hemoglobin: 15.3 g/dL (ref 13.0–17.0)
Potassium: 4 mEq/L (ref 3.7–5.3)
Sodium: 139 mEq/L (ref 137–147)
TCO2: 22 mmol/L (ref 0–100)

## 2013-11-18 LAB — APTT: aPTT: 31 seconds (ref 24–37)

## 2013-11-18 LAB — CREATININE, SERUM
Creatinine, Ser: 0.58 mg/dL (ref 0.50–1.35)
GFR calc non Af Amer: >90 mL/min (ref >90)

## 2013-11-18 LAB — SODIUM
SODIUM: 137 meq/L (ref 137–147)
SODIUM: 139 meq/L (ref 137–147)

## 2013-11-18 LAB — POCT I-STAT TROPONIN I: TROPONIN I, POC: 0.01 ng/mL (ref 0.00–0.08)

## 2013-11-18 LAB — MRSA PCR SCREENING: MRSA by PCR: NEGATIVE

## 2013-11-18 LAB — CK: CK TOTAL: 127 U/L (ref 7–232)

## 2013-11-18 LAB — TROPONIN I: Troponin I: <0.3 ng/mL (ref <0.30)

## 2013-11-18 MED ORDER — ENOXAPARIN SODIUM 40 MG/0.4ML ~~LOC~~ SOLN
40.0000 mg | SUBCUTANEOUS | Status: DC
  Filled 2013-11-18: qty 0.4

## 2013-11-18 MED ORDER — ONDANSETRON HCL 4 MG/2ML IJ SOLN
INTRAMUSCULAR | Status: AC
  Filled 2013-11-18: qty 2

## 2013-11-18 MED ORDER — IOHEXOL 350 MG/ML SOLN
80.0000 mL | Freq: Once | INTRAVENOUS | Status: AC | PRN
  Administered 2013-11-18: 80 mL via INTRAVENOUS

## 2013-11-18 MED ORDER — LIDOCAINE HCL 2 % EX GEL
Freq: Once | CUTANEOUS | Status: DC
  Filled 2013-11-18 (×2): qty 5

## 2013-11-18 MED ORDER — SODIUM CHLORIDE 3 % IV SOLN
INTRAVENOUS | Status: DC
  Administered 2013-11-18 – 2013-11-20 (×6): via INTRAVENOUS
  Filled 2013-11-18 (×12): qty 500

## 2013-11-18 MED ORDER — INSULIN ASPART 100 UNIT/ML ~~LOC~~ SOLN
0.0000 [IU] | SUBCUTANEOUS | Status: DC
  Administered 2013-11-18 (×2): 2 [IU] via SUBCUTANEOUS
  Administered 2013-11-19: 3 [IU] via SUBCUTANEOUS
  Administered 2013-11-19 (×2): 2 [IU] via SUBCUTANEOUS
  Administered 2013-11-19: 3 [IU] via SUBCUTANEOUS
  Administered 2013-11-19 – 2013-11-21 (×12): 2 [IU] via SUBCUTANEOUS
  Administered 2013-11-21 (×2): 3 [IU] via SUBCUTANEOUS
  Administered 2013-11-21: 2 [IU] via SUBCUTANEOUS
  Administered 2013-11-22 (×2): 3 [IU] via SUBCUTANEOUS
  Administered 2013-11-22 (×2): 2 [IU] via SUBCUTANEOUS
  Administered 2013-11-22: 3 [IU] via SUBCUTANEOUS
  Administered 2013-11-23 (×3): 2 [IU] via SUBCUTANEOUS
  Administered 2013-11-23 – 2013-11-24 (×4): 3 [IU] via SUBCUTANEOUS
  Administered 2013-11-24: 2 [IU] via SUBCUTANEOUS
  Administered 2013-11-24: 3 [IU] via SUBCUTANEOUS
  Administered 2013-11-24 (×2): 2 [IU] via SUBCUTANEOUS
  Administered 2013-11-24 – 2013-11-25 (×4): 3 [IU] via SUBCUTANEOUS
  Administered 2013-11-25: 5 [IU] via SUBCUTANEOUS
  Administered 2013-11-25 – 2013-11-26 (×8): 3 [IU] via SUBCUTANEOUS
  Administered 2013-11-27: 2 [IU] via SUBCUTANEOUS
  Administered 2013-11-27 – 2013-11-28 (×6): 3 [IU] via SUBCUTANEOUS
  Administered 2013-11-28: 2 [IU] via SUBCUTANEOUS
  Administered 2013-11-28 – 2013-11-29 (×2): 3 [IU] via SUBCUTANEOUS
  Administered 2013-11-29: 2 [IU] via SUBCUTANEOUS
  Administered 2013-11-29: 3 [IU] via SUBCUTANEOUS
  Administered 2013-11-29: 2 [IU] via SUBCUTANEOUS
  Administered 2013-11-29: 3 [IU] via SUBCUTANEOUS
  Administered 2013-11-30: 5 [IU] via SUBCUTANEOUS
  Administered 2013-11-30 (×2): 3 [IU] via SUBCUTANEOUS

## 2013-11-18 MED ORDER — SODIUM CHLORIDE 0.9 % IV BOLUS (SEPSIS)
1000.0000 mL | Freq: Once | INTRAVENOUS | Status: AC
  Administered 2013-11-18: 1000 mL via INTRAVENOUS

## 2013-11-18 MED ORDER — LIDOCAINE HCL 2 % EX GEL
Freq: Once | CUTANEOUS | Status: DC
  Filled 2013-11-18: qty 5

## 2013-11-18 MED ORDER — ONDANSETRON HCL 4 MG/2ML IJ SOLN
4.0000 mg | Freq: Four times a day (QID) | INTRAMUSCULAR | Status: DC | PRN
  Administered 2013-11-18: 4 mg via INTRAVENOUS

## 2013-11-18 NOTE — Progress Notes (Signed)
*  PRELIMINARY RESULTS* Vascular Ultrasound Carotid Duplex (Doppler) has been completed.  Preliminary findings: Left = 1-39% ICA stenosis. Antegrade vertebral flow. Right = very limited views due to patient immobility to turn head. Cannot definitely determine grade of stenosis. No obvious significant stenosis noted.    Farrel Demark, RDMS, RVT  11/18/2013, 3:19 PM

## 2013-11-18 NOTE — Progress Notes (Signed)
Chest x-ray for central line placement reviewed by Dr. Craige Cotta and okay for use. Will continue to monitor.

## 2013-11-18 NOTE — ED Provider Notes (Signed)
I saw and evaluated the patient, reviewed the resident's note and I agree with the findings and plan.  EKG Interpretation    Date/Time:    Ventricular Rate:    PR Interval:    QRS Duration:   QT Interval:    QTC Calculation:   R Axis:     Text Interpretation:             Patient with significant CVA.  Neurology involved early in his care.  CT scan of the head demonstrates large right MCA.  Admit to stroke service  Ct Head Wo Contrast  11/18/2013   CLINICAL DATA:  Ectasia, right static days, no movement on the left side of the body  EXAM: CT HEAD WITHOUT CONTRAST  TECHNIQUE: Contiguous axial images were obtained from the base of the skull through the vertex without intravenous contrast.  COMPARISON:  None.  FINDINGS: There is no evidence of mass effect, midline shift, or extra-axial fluid collections. There is no evidence of a space-occupying lesion or intracranial hemorrhage. There is a large area of low attenuation in the right frontal, temporal and parietal lobe with loss of the normal gray-white differentiation most consistent with an acute -subacute infarction. There is a hyperdense right MCA concerning for thrombus. There is generalized cerebral atrophy. There is periventricular white matter low attenuation likely secondary to microangiopathy.  The ventricles and sulci are appropriate for the patient's age. The basal cisterns are patent.  Visualized portions of the orbits are unremarkable. The visualized portions of the paranasal sinuses and mastoid air cells are unremarkable. Cerebrovascular atherosclerotic calcifications are noted.  The osseous structures are unremarkable.  IMPRESSION: 1. 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 There is a hyperdense right MCA concerning for thrombus. Critical Value/emergent results were called by telephone at the time of interpretation on 11/18/2013 at 12:25 PM to Dr. Melene Plan , who verbally acknowledged these results.    Electronically Signed   By: Elige Ko   On: 11/18/2013 12:27   Dg Chest Portable 1 View  11/18/2013   CLINICAL DATA:  Weakness and altered mental status.  EXAM: PORTABLE CHEST - 1 VIEW  COMPARISON:  None.  FINDINGS: The lungs are borderline hypoinflated. The interstitial markings are increased bilaterally and are nearly confluent in some areas on the left. No air bronchograms are demonstrated. The cardiac silhouette is enlarged. The pulmonary vascularity is engorged and indistinct. There is no pleural effusion or pneumothorax. The observed portions of the bony thorax appear normal.  IMPRESSION: The findings suggest congestive heart failure with pulmonary interstitial and early alveolar edema.   Electronically Signed   By: David  [place]   On: 11/18/2013 11:22     Lyanne Co, MD 11/18/13 1620

## 2013-11-18 NOTE — ED Notes (Signed)
NOTIFIED DR. Patria Mane OF CODE STROKE PATIENT LAB RESULTS OF I-STAT CHEM 8+ ,AND I-STAT TROPONIN, 11/18/2013.

## 2013-11-18 NOTE — ED Notes (Signed)
Neuro at the bedside.

## 2013-11-18 NOTE — Consult Note (Signed)
PULMONARY / CRITICAL CARE MEDICINE  Name: Todd Vargas MRN: [id number] DOB: 05/16/1946    ADMISSION DATE:  11/18/2013 CONSULTATION DATE:  11/18/2013  REFERRING MD :  Neurology PRIMARY SERVICE: Neurology  CHIEF COMPLAINT:  Stroke   BRIEF PATIENT DESCRIPTION: 68 yo admitted on 1/26 w/ right MCA CVA. Deficits included: severe dysarthria and left sided neglect. PCCM asked to assess for CVL placement in effort to provide hypertonic saline and out of concern for inability to protect airway / impending respiratory failure.  SIGNIFICANT EVENTS / STUDIES:  1/26  Head CT >>> Evolving right MCA CVA   LINES / TUBES: L IJ CVL 1/26 >>>  CULTURES:  ANTIBIOTICS:  HISTORY OF PRESENT ILLNESS:   68 y.o. male - last seen in usual state at 8 pm 1/25. He was found the am of 1/26 with right eye gaze deviation, left arm hemiplegia and incontinent of stool and urine. CT head obtained on arrival shows a dense right MCA sign and evolving right MCA infarct.He was able to follow simple commands with his right side, able to say minimal words such as two, five yes but very dysarthric. He was admitted by the stroke service and PCCM asked to assist w/ care   PAST MEDICAL HISTORY :  Past Medical History  Diagnosis Date  . Diabetes mellitus without complication   . HTN (hypertension)   . Hyperlipidemia    History reviewed. No pertinent past surgical history. Prior to Admission medications   Not on File   No Known Allergies  FAMILY HISTORY:  History reviewed. No pertinent family history.  SOCIAL HISTORY:  has no tobacco, alcohol, and drug history on file.  REVIEW OF SYSTEMS:  Unable to provide  SUBJECTIVE:   VITAL SIGNS: Temp:  [97.4 F (36.3 C)-98.3 F (36.8 C)] 98.3 F (36.8 C) (01/26 1600) Pulse Rate:  [63-84] 83 (01/26 1500) Resp:  [20-31] 23 (01/26 1500) BP: (145-176)/(83-108) 173/90 mmHg (01/26 1500) SpO2:  [91 %-99 %] 94 % (01/26 1500) Weight:  [131.6 kg (290 lb 2 oz)] 131.6 kg (290 lb 2 oz)  (01/26 1414) HEMODYNAMICS:   VENTILATOR SETTINGS:   INTAKE / OUTPUT: Intake/Output   None     PHYSICAL EXAMINATION: General:  Resting comfortably w/out distress.  Neuro:  Dysarthric. Follows commands, left sided hemiparesis. asymmetric on left  HEENT:  PERRL  Cardiovascular:  rrr Lungs:  Clear  Abdomen:  Soft, non-tender  Musculoskeletal:  Left sided weakness  Skin:  Intact   LABS:  CBC  Recent Labs Lab 11/18/13 1139 11/18/13 1155 11/18/13 1500  WBC 7.3  --  8.7  HGB 13.3 15.3 12.7*  HCT 40.6 45.0 38.8*  PLT 154  --  179   Coag's  Recent Labs Lab 11/18/13 1139 11/18/13 1144  APTT  --  31  INR 1.01  --    BMET  Recent Labs Lab 11/18/13 1139 11/18/13 1155 11/18/13 1500  NA 138 139  --   K 4.2 4.0  --   CL 101 103  --   CO2 21  --   --   BUN 7 5*  --   CREATININE 0.49* 0.60 0.58  GLUCOSE 180* 183*  --    Electrolytes  Recent Labs Lab 11/18/13 1139  CALCIUM 8.8   Sepsis Markers  Recent Labs Lab 11/18/13 1139  LATICACIDVEN 3.2*   ABG No results found for this basename: PHART, PCO2ART, PO2ART,  in the last 168 hours  Liver Enzymes  Recent Labs Lab 11/18/13 1139  AST  20  ALT 17  ALKPHOS 136*  BILITOT 0.4  ALBUMIN 4.0   Cardiac Enzymes  Recent Labs Lab 11/18/13 1139  TROPONINI <0.30   Glucose  Recent Labs Lab 11/18/13 1507  GLUCAP 143*   CXR: 1/26 >>> Small lung volumes, bilateral airspace disease  ASSESSMENT / PLAN:  PULMONARY A:  Possible aspiration in setting of acute stroke Less likely pulmonary edema Impending respiratory failure / at risk for needing airway / mechanical ventialtion P:   Supplemental oxygen for goal SpO2>92 BiPAP / NIMV contraindicated  CARDIOVASCULAR A:  HTN P:  Goal BP< 180/90  RENAL A:  No active issues P:   Trend BMP  GASTROINTESTINAL A:   Dysphagia GI Px is not indicated P:   NPO   HEMATOLOGIC A:   Anemia No overt hemorrhage P:  Trend CBC SCDs  INFECTIOUS A:    No acute issues P:   Monitor off abx  ENDOCRINE A:   DM II P:   SSI  NEUROLOGIC A:   Right MCA CVA  Acute encephalopathy P:   Neurology following Hypertonic saline  Carotid Doppler TTE  I have personally obtained history, examined patient, evaluated and interpreted laboratory and imaging results, reviewed medical records, formulated assessment / plan and placed orders.  CRITICAL CARE:  The patient is critically ill with multiple organ systems failure and requires high complexity decision making for assessment and support, frequent evaluation and titration of therapies, application of advanced monitoring technologies and extensive interpretation of multiple databases. Critical Care Time devoted to patient care services described in this note is 35 minutes.   Lonia Farber, MD Pulmonary and Critical Care Medicine [facility] Pager: [phone]  11/18/2013, 5:03 PM

## 2013-11-18 NOTE — Procedures (Signed)
Central Venous Catheter Insertion Procedure Note Todd Vargas [id number] 03-19-1946  Procedure: Insertion of Central Venous Catheter Indications: Assessment of intravascular volume, Drug and/or fluid administration and Frequent blood sampling  Procedure Details Consent: Risks of procedure as well as the alternatives and risks of each were explained to the (patient/caregiver).  Consent for procedure obtained. Time Out: Verified patient identification, verified procedure, site/side was marked, verified correct patient position, special equipment/implants available, medications/allergies/relevent history reviewed, required imaging and test results available.  Performed  Maximum sterile technique was used including antiseptics, cap, gloves, gown, hand hygiene, mask and sheet. Skin prep: Chlorhexidine; local anesthetic administered A antimicrobial bonded/coated triple lumen catheter was placed in the left internal jugular vein using the Seldinger technique.  Evaluation Blood flow good Complications: No apparent complications Patient did tolerate procedure well. Chest X-ray ordered to verify placement.  CXR: pending.  BABCOCK,PETE 11/18/2013, 4:44 PM  Supervised and present through the entire procedure.  Lonia Farber, MD Pulmonary and Critical Care Medicine [facility] Pager: [phone]

## 2013-11-18 NOTE — ED Notes (Signed)
Notified EDP that foley was attempted but resistance met. Instructed to wait on foley placement.

## 2013-11-18 NOTE — Progress Notes (Signed)
S:  Called to bedside to assess patient post episode of vomiting  O: Filed Vitals:   11/18/13 1700 11/18/13 1800 11/18/13 1900 11/18/13 2000  BP: 145/76 169/101 145/82   Pulse: 72 83 59 75  Temp:      TempSrc:      Resp: 20 26 10 20   Height:      Weight:      SpO2: 97% 96% 98% 96%     General: obese adult male, L sided deficit Neuro: awakens to voice, names place, attempts to communicate, dysarthria  CV: s1s2 rrr, no m/r/g PULM: resp's even/non-labored on 2L/[place], lungs bilaterally essentially clear, weak cough GI: round/soft, bsx4 active Extremities: warm/dry, no edema   A:  CVA Nausea / Vomiting Rule out Aspiration   P: -oxygen to support sats > 92% -f/u cxr in am, if change in lung sounds or saturations, obtain sooner -pulmonary hygiene as able -not candidate for bipap, if decompensates, would progress to intubation   Canary Brim, NP-C [facility] Pgr: [phone] or [phone]  Billy Fischer, MD ; [facility] service [place] [phone].  After 5:30 PM or weekends, call [phone]

## 2013-11-18 NOTE — Consult Note (Signed)
Reason for Consult: Consideration of decompressive craniotomy Referring Physician: Chucky Homes is an 68 y.o. male.  HPI: Patient is a 58 year old individual who was found down by friends. He was hemiplegic on the left side and using continent of bowel and bladder when found. CT scan demonstrates a large right middle cerebral artery infarct. Consideration is now given to decompressive craniectomy. CT scan was performed at 10 PM which shows that the patient has increasing erythema in the whole right hemisphere. There is now a few millimeters of shift.  Past Medical History  Diagnosis Date  . Diabetes mellitus without complication   . HTN (hypertension)   . Hyperlipidemia     History reviewed. No pertinent past surgical history.  History reviewed. No pertinent family history.  Social History:  has no tobacco, alcohol, and drug history on file.  Allergies: No Known Allergies  Medications: I have reviewed the patient's current medications.  Results for orders placed during the hospital encounter of 11/18/13 (from the past 48 hour(s))  CBC WITH DIFFERENTIAL     Status: Abnormal   Collection Time    11/18/13 11:39 AM      Result Value Range   WBC 7.3  4.0 - 10.5 K/uL   RBC 5.21  4.22 - 5.81 MIL/uL   Hemoglobin 13.3  13.0 - 17.0 g/dL   HCT 40.6  39.0 - 52.0 %   MCV 77.9 (*) 78.0 - 100.0 fL   MCH 25.5 (*) 26.0 - 34.0 pg   MCHC 32.8  30.0 - 36.0 g/dL   RDW 14.9  11.5 - 15.5 %   Platelets 154  150 - 400 K/uL   Neutrophils Relative % 75  43 - 77 %   Neutro Abs 5.5  1.7 - 7.7 K/uL   Lymphocytes Relative 20  12 - 46 %   Lymphs Abs 1.5  0.7 - 4.0 K/uL   Monocytes Relative 4  3 - 12 %   Monocytes Absolute 0.3  0.1 - 1.0 K/uL   Eosinophils Relative 0  0 - 5 %   Eosinophils Absolute 0.0  0.0 - 0.7 K/uL   Basophils Relative 0  0 - 1 %   Basophils Absolute 0.0  0.0 - 0.1 K/uL  COMPREHENSIVE METABOLIC PANEL     Status: Abnormal   Collection Time    11/18/13 11:39 AM       Result Value Range   Sodium 138  137 - 147 mEq/L   Potassium 4.2  3.7 - 5.3 mEq/L   Chloride 101  96 - 112 mEq/L   CO2 21  19 - 32 mEq/L   Glucose, Bld 180 (*) 70 - 99 mg/dL   BUN 7  6 - 23 mg/dL   Creatinine, Ser 0.49 (*) 0.50 - 1.35 mg/dL   Calcium 8.8  8.4 - 10.5 mg/dL   Total Protein 7.7  6.0 - 8.3 g/dL   Albumin 4.0  3.5 - 5.2 g/dL   AST 20  0 - 37 U/L   ALT 17  0 - 53 U/L   Alkaline Phosphatase 136 (*) 39 - 117 U/L   Total Bilirubin 0.4  0.3 - 1.2 mg/dL   GFR calc non Af Amer 66 (*) >90 mL/min   GFR calc Af Amer 76 (*) >90 mL/min   Comment: (NOTE)     The eGFR has been calculated using the CKD EPI equation.     This calculation has not been validated in all clinical situations.  eGFR's persistently <90 mL/min signify possible Chronic Kidney     Disease.  PROTIME-INR     Status: None   Collection Time    11/18/13 11:39 AM      Result Value Range   Prothrombin Time 13.1  11.6 - 15.2 seconds   INR 1.01  0.00 - 1.49  CK     Status: None   Collection Time    11/18/13 11:39 AM      Result Value Range   Total CK 127  7 - 232 U/L  LACTIC ACID, PLASMA     Status: Abnormal   Collection Time    11/18/13 11:39 AM      Result Value Range   Lactic Acid, Venous 3.2 (*) 0.5 - 2.2 mmol/L  TROPONIN I     Status: None   Collection Time    11/18/13 11:39 AM      Result Value Range   Troponin I <0.30  <0.30 ng/mL   Comment:            Due to the release kinetics of cTnI,     a negative result within the first hours     of the onset of symptoms does not rule out     myocardial infarction with certainty.     If myocardial infarction is still suspected,     repeat the test at appropriate intervals.  APTT     Status: None   Collection Time    11/18/13 11:44 AM      Result Value Range   aPTT 31  24 - 37 seconds  ETHANOL     Status: None   Collection Time    11/18/13 11:44 AM      Result Value Range   Alcohol, Ethyl (B) <11  0 - 11 mg/dL   Comment:            LOWEST  DETECTABLE LIMIT FOR     SERUM ALCOHOL IS 11 mg/dL     FOR MEDICAL PURPOSES ONLY  POCT I-STAT TROPONIN I     Status: None   Collection Time    11/18/13 11:53 AM      Result Value Range   Troponin i, poc 0.01  0.00 - 0.08 ng/mL   Comment 3            Comment: Due to the release kinetics of cTnI,     a negative result within the first hours     of the onset of symptoms does not rule out     myocardial infarction with certainty.     If myocardial infarction is still suspected,     repeat the test at appropriate intervals.  POCT I-STAT, CHEM 8     Status: Abnormal   Collection Time    11/18/13 11:55 AM      Result Value Range   Sodium 139  137 - 147 mEq/L   Potassium 4.0  3.7 - 5.3 mEq/L   Chloride 103  96 - 112 mEq/L   BUN 5 (*) 6 - 23 mg/dL   Creatinine, Ser 0.60  0.50 - 1.35 mg/dL   Glucose, Bld 183 (*) 70 - 99 mg/dL   Calcium, Ion 1.16  1.13 - 1.30 mmol/L   TCO2 22  0 - 100 mmol/L   Hemoglobin 15.3  13.0 - 17.0 g/dL   HCT 45.0  39.0 - 52.0 %  CBC     Status: Abnormal   Collection Time  11/18/13  3:00 PM      Result Value Range   WBC 8.7  4.0 - 10.5 K/uL   RBC 4.96  4.22 - 5.81 MIL/uL   Hemoglobin 12.7 (*) 13.0 - 17.0 g/dL   Comment: REPEATED TO VERIFY     SPECIMEN CHECKED FOR CLOTS     DELTA CHECK NOTED   HCT 38.8 (*) 39.0 - 52.0 %   MCV 78.2  78.0 - 100.0 fL   MCH 25.6 (*) 26.0 - 34.0 pg   MCHC 32.7  30.0 - 36.0 g/dL   RDW 15.0  11.5 - 15.5 %   Platelets 179  150 - 400 K/uL  CREATININE, SERUM     Status: None   Collection Time    11/18/13  3:00 PM      Result Value Range   Creatinine, Ser 0.58  0.50 - 1.35 mg/dL   GFR calc non Af Amer >90  >90 mL/min   GFR calc Af Amer >90  >90 mL/min   Comment: (NOTE)     The eGFR has been calculated using the CKD EPI equation.     This calculation has not been validated in all clinical situations.     eGFR's persistently <90 mL/min signify possible Chronic Kidney     Disease.  GLUCOSE, CAPILLARY     Status: Abnormal    Collection Time    11/18/13  3:07 PM      Result Value Range   Glucose-Capillary 143 (*) 70 - 99 mg/dL  SODIUM     Status: None   Collection Time    11/18/13  4:03 PM      Result Value Range   Sodium 137  137 - 147 mEq/L  GLUCOSE, CAPILLARY     Status: Abnormal   Collection Time    11/18/13  5:32 PM      Result Value Range   Glucose-Capillary 138 (*) 70 - 99 mg/dL   Comment 1 Notify RN     Comment 2 Documented in Chart    SODIUM     Status: None   Collection Time    11/18/13  6:45 PM      Result Value Range   Sodium 139  137 - 147 mEq/L    Ct Head Wo Contrast  11/18/2013   CLINICAL DATA:  Ectasia, right static days, no movement on the left side of the body  EXAM: CT HEAD WITHOUT CONTRAST  TECHNIQUE: Contiguous axial images were obtained from the base of the skull through the vertex without intravenous contrast.  COMPARISON:  None.  FINDINGS: There is no evidence of mass effect, midline shift, or extra-axial fluid collections. There is no evidence of a space-occupying lesion or intracranial hemorrhage. There is a large area of low attenuation in the right frontal, temporal and parietal lobe with loss of the normal gray-white differentiation most consistent with an acute -subacute infarction. There is a hyperdense right MCA concerning for thrombus. There is generalized cerebral atrophy. There is periventricular white matter low attenuation likely secondary to microangiopathy.  The ventricles and sulci are appropriate for the patient's age. The basal cisterns are patent.  Visualized portions of the orbits are unremarkable. The visualized portions of the paranasal sinuses and mastoid air cells are unremarkable. Cerebrovascular atherosclerotic calcifications are noted.  The osseous structures are unremarkable.  IMPRESSION: 1. 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 There is a hyperdense right MCA concerning for thrombus. Critical Value/emergent results were called by  telephone at the  time of interpretation on 11/18/2013 at 12:25 PM to Dr. Deno Etienne , who verbally acknowledged these results.   Electronically Signed   By: Kathreen Devoid   On: 11/18/2013 12:27   Dg Chest Port 1 View  11/18/2013   CLINICAL DATA:  Central line placement.  EXAM: PORTABLE CHEST - 1 VIEW  COMPARISON:  11/18/2013, 1051 hr  FINDINGS: Film at 1728 hr demonstrates placement of a left jugular central line with the tip transversely oriented at the brachiocephalic venous confluence/origin of SVC. No pneumothorax is identified. Lungs again demonstrate edema. The heart is enlarged.  IMPRESSION: Central line tip at the brachiocephalic venous confluence. No pneumothorax is identified.   Electronically Signed   By: Aletta Edouard M.D.   On: 11/18/2013 17:57   Dg Chest Portable 1 View  11/18/2013   CLINICAL DATA:  Weakness and altered mental status.  EXAM: PORTABLE CHEST - 1 VIEW  COMPARISON:  None.  FINDINGS: The lungs are borderline hypoinflated. The interstitial markings are increased bilaterally and are nearly confluent in some areas on the left. No air bronchograms are demonstrated. The cardiac silhouette is enlarged. The pulmonary vascularity is engorged and indistinct. There is no pleural effusion or pneumothorax. The observed portions of the bony thorax appear normal.  IMPRESSION: The findings suggest congestive heart failure with pulmonary interstitial and early alveolar edema.   Electronically Signed   By: David  [place]   On: 11/18/2013 11:22    Review of Systems  Unable to perform ROS: acuity of condition   Blood pressure 151/80, pulse 75, temperature 98.3 F (36.8 C), temperature source Oral, resp. rate 22, height 6' 3"  (1.905 m), weight 131.6 kg (290 lb 2 oz), SpO2 96.00%. Physical Exam  Constitutional: He appears well-developed and well-nourished.  Obese  Neurological:  Does not open eyes spontaneously. Fixed right-sided gaze. No spontaneous speech. Left side with flaccid paralysis of  upper and lower extremity. Left facial. Right side moves purposefully in the upper extremity. Right lower extremity also with purposeful movement. Does not follow commands at this point. Pupils are 2 mm and reactive.  Skin: Skin is warm and dry.    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 I do not feel the patient is a good candidate for craniectomy and that the best course of survival patient will be hemiplegic and markedly dependent. In addition other comorbidities make this patient's chance of survival very slim with or without surgery. I do not feel adding to his morbidity with a substantial craniectomy is in his best interest.  I've discussed the situation and my concerns with the patient's niece. We'll continue supportive care at this time.  Avonelle Viveros J 11/18/2013, 10:34 PM

## 2013-11-18 NOTE — ED Provider Notes (Signed)
CSN: [id number]     Arrival date & time 11/18/13  1013 History   First MD Initiated Contact with Patient 11/18/13 1041     Chief Complaint  Patient presents with  . Weakness  . Altered Mental Status   (Consider location/radiation/quality/duration/timing/severity/associated sxs/prior Treatment) Patient is a 68 y.o. male presenting with Acute Neurological Problem. The history is provided by the EMS personnel. The history is limited by the condition of the patient.  Cerebrovascular Accident This is a new problem. The current episode started today. The problem occurs constantly. The problem has been unchanged. Associated symptoms comments: Unable to obtain. Nothing aggravates the symptoms. He has tried nothing for the symptoms. The treatment provided no relief.    Approximately 68 year old African American male chief complaint of altered mental status. EMS was called this morning after his last seen well was around 8 PM last night. Patient with right sided deviated gaze and no movement of his left upper left lower extremity will follow simple commands. Incontinent of stool or urine over an unknown time.   Past Medical History  Diagnosis Date  . Diabetes mellitus without complication   . HTN (hypertension)   . Hyperlipidemia    History reviewed. No pertinent past surgical history. History reviewed. No pertinent family history. History  Substance Use Topics  . Smoking status: Not on file  . Smokeless tobacco: Not on file  . Alcohol Use: Not on file    Review of Systems  Allergies  Review of patient's allergies indicates no known allergies.  Home Medications  No current outpatient prescriptions on file. BP 173/90  Pulse 83  Temp(Src) 97.4 F (36.3 C) (Rectal)  Resp 23  Ht 6\' 3"  (1.905 m)  Wt 290 lb 2 oz (131.6 kg)  BMI 36.26 kg/m2  SpO2 94% Physical Exam  Constitutional: He appears well-developed and well-nourished.  HENT:  Head: Normocephalic and atraumatic.  Eyes: EOM  are normal. Pupils are equal, round, and reactive to light.  Neck: Normal range of motion. Neck supple. No JVD present.  Cardiovascular: Normal rate and regular rhythm.  Exam reveals no gallop and no friction rub.   No murmur heard. Pulmonary/Chest: No respiratory distress. He has no wheezes. He has no rales.  Abdominal: He exhibits no distension. There is no tenderness. There is no rebound and no guarding.  Musculoskeletal: Normal range of motion.  Neurological: GCS eye subscore is 4. GCS verbal subscore is 2. GCS motor subscore is 6. He displays no Babinski's sign on the right side. He displays no Babinski's sign on the left side.  Reflex Scores:      Tricep reflexes are 2+ on the right side and 2+ on the left side.      Bicep reflexes are 2+ on the right side and 2+ on the left side.      Brachioradialis reflexes are 2+ on the right side and 2+ on the left side.      Patellar reflexes are 2+ on the right side and 0 on the left side.      Achilles reflexes are 2+ on the right side and 2+ on the left side. Left sided facial droop.  No noted movement of LUE or LLE.  Right-sided eye deviation. Left-sided neglect  Skin: No rash noted. No pallor.  Psychiatric: He has a normal mood and affect. His behavior is normal.    ED Course  Procedures (including critical care time) Labs Review Labs Reviewed  CBC WITH DIFFERENTIAL - Abnormal; Notable for the  following:    MCV 77.9 (*)    MCH 25.5 (*)    All other components within normal limits  COMPREHENSIVE METABOLIC PANEL - Abnormal; Notable for the following:    Glucose, Bld 180 (*)    Creatinine, Ser 0.49 (*)    Alkaline Phosphatase 136 (*)    GFR calc non Af Amer 66 (*)    GFR calc Af Amer 76 (*)    All other components within normal limits  LACTIC ACID, PLASMA - Abnormal; Notable for the following:    Lactic Acid, Venous 3.2 (*)    All other components within normal limits  GLUCOSE, CAPILLARY - Abnormal; Notable for the following:     Glucose-Capillary 143 (*)    All other components within normal limits  POCT I-STAT, CHEM 8 - Abnormal; Notable for the following:    BUN 5 (*)    Glucose, Bld 183 (*)    All other components within normal limits  MRSA PCR SCREENING  PROTIME-INR  CK  APTT  TROPONIN I  ETHANOL  URINALYSIS, ROUTINE W REFLEX MICROSCOPIC  CBC  CREATININE, SERUM  SODIUM  SODIUM  SODIUM  POCT I-STAT TROPONIN I   Imaging Review Ct Head Wo Contrast  11/18/2013   CLINICAL DATA:  Ectasia, right static days, no movement on the left side of the body  EXAM: CT HEAD WITHOUT CONTRAST  TECHNIQUE: Contiguous axial images were obtained from the base of the skull through the vertex without intravenous contrast.  COMPARISON:  None.  FINDINGS: There is no evidence of mass effect, midline shift, or extra-axial fluid collections. There is no evidence of a space-occupying lesion or intracranial hemorrhage. There is a large area of low attenuation in the right frontal, temporal and parietal lobe with loss of the normal gray-white differentiation most consistent with an acute -subacute infarction. There is a hyperdense right MCA concerning for thrombus. There is generalized cerebral atrophy. There is periventricular white matter low attenuation likely secondary to microangiopathy.  The ventricles and sulci are appropriate for the patient's age. The basal cisterns are patent.  Visualized portions of the orbits are unremarkable. The visualized portions of the paranasal sinuses and mastoid air cells are unremarkable. Cerebrovascular atherosclerotic calcifications are noted.  The osseous structures are unremarkable.  IMPRESSION: 1. 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 There is a hyperdense right MCA concerning for thrombus. Critical Value/emergent results were called by telephone at the time of interpretation on 11/18/2013 at 12:25 PM to Dr. Melene Plan , who verbally acknowledged these results.   Electronically  Signed   By: Elige Ko   On: 11/18/2013 12:27   Dg Chest Portable 1 View  11/18/2013   CLINICAL DATA:  Weakness and altered mental status.  EXAM: PORTABLE CHEST - 1 VIEW  COMPARISON:  None.  FINDINGS: The lungs are borderline hypoinflated. The interstitial markings are increased bilaterally and are nearly confluent in some areas on the left. No air bronchograms are demonstrated. The cardiac silhouette is enlarged. The pulmonary vascularity is engorged and indistinct. There is no pleural effusion or pneumothorax. The observed portions of the bony thorax appear normal.  IMPRESSION: The findings suggest congestive heart failure with pulmonary interstitial and early alveolar edema.   Electronically Signed   By: David  [place]   On: 11/18/2013 11:22    EKG Interpretation    Date/Time:    Ventricular Rate:    PR Interval:    QRS Duration:   QT Interval:    QTC Calculation:  R Axis:     Text Interpretation:              MDM   1. CVA (cerebral infarction)      Spell a 68 year old [place] American male presenting with large MCA like territory stroke. The patient with incomprehensible sounds, right sided deviation with paralysis of left side, will obtain stroke workup. Patient hemodynamically stable while in the ED.   Patient found to have a large right MCA territory stroke. Neurology saw the patient in the ED will admit to their service will take to the ICU for possible intervention.     Melene Plan, MD 11/18/13 [phone]

## 2013-11-18 NOTE — ED Notes (Addendum)
Pt to department via EMS from home- pt was found this morning about a friend this morning in his home with left sided deficits, aphasia and right sided gaze. No movement on the left side. Friend reports that she last saw him last night around 8pm.  Bp-170/100 Cbg-192 HR-70 Friend reports a hx of diabetes. Pt also had multiple episodes of urinary incontinence and vomiting.

## 2013-11-18 NOTE — H&P (Addendum)
Referring Physician: Patria Maneampos    Chief Complaint: CVA  HPI:                                                                                                                                         Todd HammersBen Vargas is an 68 y.o. male --much of history obtained from chart--patient was LSN at 8 pm last night.  HE was found by a friend this morning with right eye gaze deviation, left arm hemiplegia and incontinent of stool and urine.  CT head obtained on arrival shows a dense right MCA sign and evolving right MCA infarct. Currently patient is in ED, he is able to follow simple commands with his right side, able to say minimal words such as two, five yes but very dysarthric.   Date last known well: Date: 11/17/2013 Time last known well: Time: 20:00 tPA Given: No: out of window for tPA  Past Medical History  Diagnosis Date  . Diabetes mellitus without complication   . HTN (hypertension)   . Hyperlipidemia     History reviewed. No pertinent past surgical history.  History reviewed. No pertinent family history. Social History:  has no tobacco, alcohol, and drug history on file.  Allergies: No Known Allergies  Medications:                                                                                                                           Current Facility-Administered Medications  Medication Dose Route Frequency Provider Last Rate Last Dose  . enoxaparin (LOVENOX) injection 40 mg  40 mg Subcutaneous Q24H Ritta SlotMcNeill Fynlee Rowlands, MD       No current outpatient prescriptions on file.     ROS:  History obtained from unobtainable from patient due to mental status  General exam: CV: S1,S2 audible Resp: CTAB Abd: S NT/ND Skin: WDI with distal leg hemosiderin staining.    Neurologic Examination:                                                                                                       Blood pressure 157/89, pulse 69, temperature 97.4 F (36.3 C), temperature source Rectal, resp. rate 31, SpO2 91.00%.  Mental Status: Alert, very dysarthric, able to only follow simple commands on the right arm and leg.   Dense left neglect Cranial Nerves: II: Discs flat bilaterally; Visual fields dense left hemianopsia, pupils equal, round, reactive to light and accommodation III,IV, VI: keeps eyes closed but when open shows no tposis, forced right gaze V,VII: smile asymmetric on the left, facial light touch sensation decreased on the left (no wincing to PP) VIII: hearing normal bilaterally IX,X: gag reflex present XI: right shoulder shrug intact--no left shoulder shrug XII: unable to get tongue to the left past midline  Motor: Right : Upper extremity   5/5    Left:     Upper extremity   0/5  Lower extremity   5/5     Lower extremity   0/5 Tone and bulk:normal tone throughout; no atrophy noted Sensory: Pinprick and light touch intact throughout, bilaterally Deep Tendon Reflexes:  Right: Upper Extremity   Left: Upper extremity   biceps (C-5 to C-6) 2/4   biceps (C-5 to C-6) 2/4 tricep (C7) 2/4    triceps (C7) 2/4 Brachioradialis (C6) 2/4  Brachioradialis (C6) 2/4  Lower Extremity Lower Extremity  quadriceps (L-2 to L-4) 1/4   quadriceps (L-2 to L-4) 1/4 Achilles (S1) 0/4   Achilles (S1) 0/4  Plantars: Mute bilaterally Cerebellar: No dysmetria noted when moving his right arm, dense plegia on left Gait: not tested due to multiple leads and inability to walk CV: pulses palpable throughout    Lab Results: Basic Metabolic Panel:  Recent Labs Lab 11/18/13 1139 11/18/13 1155  NA 138 139  K 4.2 4.0  CL 101 103  CO2 21  --   GLUCOSE 180* 183*  BUN 7 5*  CREATININE 0.49* 0.60  CALCIUM 8.8  --     Liver Function Tests:  Recent Labs Lab 11/18/13 1139  AST 20  ALT 17  ALKPHOS 136*  BILITOT 0.4  PROT 7.7  ALBUMIN 4.0   No results  found for this basename: LIPASE, AMYLASE,  in the last 168 hours No results found for this basename: AMMONIA,  in the last 168 hours  CBC:  Recent Labs Lab 11/18/13 1139 11/18/13 1155  WBC 7.3  --   NEUTROABS 5.5  --   HGB 13.3 15.3  HCT 40.6 45.0  MCV 77.9*  --   PLT 154  --     Cardiac Enzymes:  Recent Labs Lab 11/18/13 1139  CKTOTAL 127  TROPONINI <0.30    Lipid Panel: No results found for this basename: CHOL, TRIG, HDL, CHOLHDL, VLDL, LDLCALC,  in the last 168 hours  CBG: No results found for this basename: GLUCAP,  in the last 168 hours  Microbiology: No results found for this or any previous visit.  Coagulation Studies:  Recent Labs  11/18/13 1139  LABPROT 13.1  INR 1.01    Imaging: Ct Head Wo Contrast  11/18/2013   CLINICAL DATA:  Ectasia, right static days, no movement on the left side of the body  EXAM: CT HEAD WITHOUT CONTRAST  TECHNIQUE: Contiguous axial images were obtained from the base of the skull through the vertex without intravenous contrast.  COMPARISON:  None.  FINDINGS: There is no evidence of mass effect, midline shift, or extra-axial fluid collections. There is no evidence of a space-occupying lesion or intracranial hemorrhage. There is a large area of low attenuation in the right frontal, temporal and parietal lobe with loss of the normal gray-white differentiation most consistent with an acute -subacute infarction. There is a hyperdense right MCA concerning for thrombus. There is generalized cerebral atrophy. There is periventricular white matter low attenuation likely secondary to microangiopathy.  The ventricles and sulci are appropriate for the patient's age. The basal cisterns are patent.  Visualized portions of the orbits are unremarkable. The visualized portions of the paranasal sinuses and mastoid air cells are unremarkable. Cerebrovascular atherosclerotic calcifications are noted.  The osseous structures are unremarkable.  IMPRESSION: 1.  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 There is a hyperdense right MCA concerning for thrombus. Critical Value/emergent results were called by telephone at the time of interpretation on 11/18/2013 at 12:25 PM to Dr. Melene Plan , who verbally acknowledged these results.   Electronically Signed   By: Elige Ko   On: 11/18/2013 12:27   Dg Chest Portable 1 View  11/18/2013   CLINICAL DATA:  Weakness and altered mental status.  EXAM: PORTABLE CHEST - 1 VIEW  COMPARISON:  None.  FINDINGS: The lungs are borderline hypoinflated. The interstitial markings are increased bilaterally and are nearly confluent in some areas on the left. No air bronchograms are demonstrated. The cardiac silhouette is enlarged. The pulmonary vascularity is engorged and indistinct. There is no pleural effusion or pneumothorax. The observed portions of the bony thorax appear normal.  IMPRESSION: The findings suggest congestive heart failure with pulmonary interstitial and early alveolar edema.   Electronically Signed   By: David  [place]   On: 11/18/2013 11:22       Assessment and plan discussed with with attending physician and they are in agreement.    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 Patient is not a tPA candidate due to LNW of 11/17/13 at 8 PM.  Multiple attempts to reach family have been made, however the one person who answers the phone does not speak english and no other family is around.   Stroke Risk Factors - diabetes mellitus, hyperlipidemia and hypertension  Plan: 1. HgbA1c, fasting lipid panel 2. MRI, MRA  of the brain without contrast 3. PT consult, OT consult, Speech consult 4. Echocardiogram 5. Carotid dopplers 6. Prophylactic therapy-Antiplatelet med: Aspirin - dose 81 mg daily 7. Risk  Factor modification 8. ICU bed 9. SSI 10 hyopertonic saline.  11. Consider neurosurgery consultation  pending discussion with family.   I have seen and evaluated the patient. I have reviewed the above note and made appropriate changes.  This patient is critically ill and at significant risk of neurological worsening, death and care requires constant monitoring of vital  signs, hemodynamics,respiratory and cardiac monitoring, neurological assessment, discussion with family, other specialists and medical decision making of high complexity. I spent 60 minutes of neurocritical care time  in the care of  this patient.  Ritta Slot, MD [facility] [phone]  If 7pm- 7am, please page neurology on call at [phone].  11/18/2013  3:21 PM

## 2013-11-19 ENCOUNTER — Inpatient Hospital Stay (HOSPITAL_COMMUNITY): Payer: Medicare Other

## 2013-11-19 LAB — URINALYSIS, ROUTINE W REFLEX MICROSCOPIC
Bilirubin Urine: NEGATIVE
GLUCOSE, UA: NEGATIVE mg/dL
KETONES UR: NEGATIVE mg/dL
NITRITE: NEGATIVE
Protein, ur: 30 mg/dL — AB
Specific Gravity, Urine: 1.026 (ref 1.005–1.030)
Urobilinogen, UA: 0.2 mg/dL (ref 0.0–1.0)
pH: 6 (ref 5.0–8.0)

## 2013-11-19 LAB — URINE MICROSCOPIC-ADD ON

## 2013-11-19 LAB — GLUCOSE, CAPILLARY
GLUCOSE-CAPILLARY: 136 mg/dL — AB (ref 70–99)
GLUCOSE-CAPILLARY: 140 mg/dL — AB (ref 70–99)
Glucose-Capillary: 143 mg/dL — ABNORMAL HIGH (ref 70–99)
Glucose-Capillary: 181 mg/dL — ABNORMAL HIGH (ref 70–99)
Glucose-Capillary: 133 mg/dL — ABNORMAL HIGH (ref 70–99)
Glucose-Capillary: 157 mg/dL — ABNORMAL HIGH (ref 70–99)

## 2013-11-19 LAB — LIPID PANEL
CHOL/HDL RATIO: 3.8 ratio
CHOLESTEROL: 148 mg/dL (ref 0–200)
HDL: 39 mg/dL — ABNORMAL LOW (ref >39)
LDL Cholesterol: 88 mg/dL (ref 0–99)
Triglycerides: 104 mg/dL (ref <150)
VLDL: 21 mg/dL (ref 0–40)

## 2013-11-19 LAB — SODIUM
SODIUM: 141 meq/L (ref 137–147)
SODIUM: 148 meq/L — AB (ref 137–147)
Sodium: 146 mEq/L (ref 137–147)
Sodium: 147 mEq/L (ref 137–147)

## 2013-11-19 LAB — HEMOGLOBIN A1C
Hgb A1c MFr Bld: 6.9 % — ABNORMAL HIGH (ref <5.7)
Mean Plasma Glucose: 151 mg/dL — ABNORMAL HIGH (ref <117)

## 2013-11-19 MED ORDER — DILTIAZEM LOAD VIA INFUSION
20.0000 mg | Freq: Once | INTRAVENOUS | Status: AC
  Administered 2013-11-19: 20 mg via INTRAVENOUS
  Filled 2013-11-19: qty 20

## 2013-11-19 MED ORDER — HALOPERIDOL LACTATE 5 MG/ML IJ SOLN
INTRAMUSCULAR | Status: AC
  Filled 2013-11-19: qty 1

## 2013-11-19 MED ORDER — BIOTENE DRY MOUTH MT LIQD
15.0000 mL | Freq: Two times a day (BID) | OROMUCOSAL | Status: DC
  Administered 2013-11-19 – 2013-11-21 (×6): 15 mL via OROMUCOSAL

## 2013-11-19 MED ORDER — DILTIAZEM HCL 100 MG IV SOLR
5.0000 mg/h | INTRAVENOUS | Status: DC
  Administered 2013-11-19: 5 mg/h via INTRAVENOUS
  Administered 2013-11-19: 15 mg/h via INTRAVENOUS
  Administered 2013-11-19: 5 mg/h via INTRAVENOUS
  Administered 2013-11-19 – 2013-11-21 (×6): 15 mg/h via INTRAVENOUS
  Administered 2013-11-21: 10 mg/h via INTRAVENOUS
  Administered 2013-11-22: 15 mg/h via INTRAVENOUS
  Filled 2013-11-19 (×10): qty 100

## 2013-11-19 MED ORDER — ASPIRIN 300 MG RE SUPP
300.0000 mg | Freq: Every day | RECTAL | Status: DC
  Administered 2013-11-19 – 2013-11-21 (×3): 300 mg via RECTAL
  Filled 2013-11-19 (×5): qty 1

## 2013-11-19 MED ORDER — HALOPERIDOL LACTATE 5 MG/ML IJ SOLN
5.0000 mg | Freq: Once | INTRAMUSCULAR | Status: AC
  Administered 2013-11-19: 5 mg via INTRAVENOUS

## 2013-11-19 NOTE — Progress Notes (Signed)
Pt has converted into A.fib with RVR. HR in the 130-140's . Notified Annie Main, NP. Orders for a STAT 12 lead EKG and a Cardizem drip. Will continue to monitor.

## 2013-11-19 NOTE — Progress Notes (Signed)
[facility]Link Physician-Brief Progress Note Patient Name: Todd Vargas DOB: Mar 02, 1946 MRN: [id number]  Date of Service  11/19/2013   HPI/Events of Note  Call from nurse reporting that patient is agitated, hitting at staff.  Camera check shows patient with eyes closed, not following commands but hitting out with mitted hands.  VSS are stable with HR of 66, RR 13, O2 sats of 96% on  O2 and BP of 157/100 (114).  QTc on EKG less than 500   eICU Interventions  Plan: 5 mg haldol IV now Nurse to contact [facility] in 30 minutes if no response to haldol   Intervention Category Major Interventions: Delirium, psychosis, severe agitation - evaluation and management  Neev Mcmains 11/19/2013, 1:42 AM

## 2013-11-19 NOTE — Progress Notes (Signed)
Patient becoming very agitated. Trying to hit and tachycardic. Dr. Darrick Penna notified. 5mg  Haldol ordered. Todd Vargas

## 2013-11-19 NOTE — Progress Notes (Signed)
Patient's sister arrived and she and niece indicated that he would not want to be intubated if needed or have cardioversions or chest compressions. I have made the patient a limited DNR to reflect that decision.   Ritta Slot, MD [facility] [phone]  If 7pm- 7am, please page neurology on call at [phone].

## 2013-11-19 NOTE — H&P (Addendum)
PULMONARY / CRITICAL CARE MEDICINE  Name: Todd Vargas MRN: [id number] DOB: 1946-04-07    ADMISSION DATE:  11/18/2013 CONSULTATION DATE:  11/18/2013  REFERRING MD :  Neurology PRIMARY SERVICE: Neurology  CHIEF COMPLAINT:  Stroke   BRIEF PATIENT DESCRIPTION: 68 yo admitted on 1/26 w/ right MCA CVA. Deficits included: severe dysarthria and left sided neglect. PCCM asked to assess for CVL placement in effort to provide hypertonic saline and out of concern for inability to protect airway / impending respiratory failure.  SIGNIFICANT EVENTS / STUDIES:  1/26  Head CT >>> Evolving right MCA CVA   LINES / TUBES: L IJ CVL 1/26 >>> Coude cath 1/26 >>>  CULTURES:  ANTIBIOTICS:  SUBJECTIVE: Emesis overnight.  Stopped verbalizing about 4 AM. Haldol x 1 for agitation.  VITAL SIGNS: Temp:  [97.4 F (36.3 C)-99.2 F (37.3 C)] 99.2 F (37.3 C) (01/27 0349) Pulse Rate:  [59-104] 76 (01/27 0700) Resp:  [10-31] 21 (01/27 0700) BP: (136-189)/(72-108) 172/91 mmHg (01/27 0613) SpO2:  [91 %-99 %] 96 % (01/27 0700) Weight:  [131.6 kg (290 lb 2 oz)] 131.6 kg (290 lb 2 oz) (01/26 1414)  HEMODYNAMICS:   VENTILATOR SETTINGS:   INTAKE / OUTPUT: Intake/Output     01/26 0701 - 01/27 0700 01/27 0701 - 01/28 0700   I.V. (mL/kg) 993.8 (7.6)    Total Intake(mL/kg) 993.8 (7.6)    Urine (mL/kg/hr) 1700    Total Output 1700     Net -706.3          Urine Occurrence 4 x      PHYSICAL EXAMINATION: General:  Restless, agitated Neuro:  Aphasia, decreased strength L, gag / cough present HEENT:  Pinpoint equal pupils Cardiovascular:  Irregular, no murmurs Lungs:  Bilateral vesicular sounds Abdomen:  Soft, non-tender  Musculoskeletal:  Trace edema Skin:  Intact   LABS:  CBC  Recent Labs Lab 11/18/13 1139 11/18/13 1155 11/18/13 1500  WBC 7.3  --  8.7  HGB 13.3 15.3 12.7*  HCT 40.6 45.0 38.8*  PLT 154  --  179   Coag's  Recent Labs Lab 11/18/13 1139 11/18/13 1144  APTT  --  31  INR  1.01  --    BMET  Recent Labs Lab 11/18/13 1139 11/18/13 1155 11/18/13 1500 11/18/13 1603 11/18/13 1845 11/19/13 0200  NA 138 139  --  137 139 141  K 4.2 4.0  --   --   --   --   CL 101 103  --   --   --   --   CO2 21  --   --   --   --   --   BUN 7 5*  --   --   --   --   CREATININE 0.49* 0.60 0.58  --   --   --   GLUCOSE 180* 183*  --   --   --   --    Electrolytes  Recent Labs Lab 11/18/13 1139  CALCIUM 8.8   Sepsis Markers  Recent Labs Lab 11/18/13 1139  LATICACIDVEN 3.2*   ABG No results found for this basename: PHART, PCO2ART, PO2ART,  in the last 168 hours  Liver Enzymes  Recent Labs Lab 11/18/13 1139  AST 20  ALT 17  ALKPHOS 136*  BILITOT 0.4  ALBUMIN 4.0   Cardiac Enzymes  Recent Labs Lab 11/18/13 1139  TROPONINI <0.30   Glucose  Recent Labs Lab 11/18/13 1507 11/18/13 1732 11/18/13 1935 11/19/13 0026 11/19/13 0348  GLUCAP 143* 138* 128* 140* 143*   CXR: 1/27 >>> Hardware in good position, small lung volumes, bilateral airspace disease  ASSESSMENT / PLAN:  PULMONARY A:  Possible aspiration in setting of acute stroke Less likely pulmonary edema Impending respiratory failure / at risk for needing airway / mechanical ventialtion P:   Supplemental oxygen for goal SpO2>92 BiPAP / NIMV contraindicated Needs goals of care discussion as guarded prognosis with or without intubation  CARDIOVASCULAR A:  HTN P:  Goal BP< 180/90 Need to address code status with fasmily  RENAL A:  No active issues P:   Trend BMP  GASTROINTESTINAL A:   Dysphagia GI Px is not indicated P:   NPO  Consider NGT/TF  HEMATOLOGIC A:   Anemia No overt hemorrhage P:  Trend CBC SCDs  INFECTIOUS A:   No acute issues P:   Monitor off abx  ENDOCRINE A:   DM II P:   SSI  NEUROLOGIC A:   Right MCA CVA  Acute encephalopathy P:   Neurology following Hypertonic saline  Carotid Doppler TTE  I have personally obtained history,  examined patient, evaluated and interpreted laboratory and imaging results, reviewed medical records, formulated assessment / plan and placed orders.  CRITICAL CARE:  The patient is critically ill with multiple organ systems failure and requires high complexity decision making for assessment and support, frequent evaluation and titration of therapies, application of advanced monitoring technologies and extensive interpretation of multiple databases. Critical Care Time devoted to patient care services described in this note is 35 minutes.   Lonia FarberZUBELEVITSKIY, Kristilyn Coltrane, MD Pulmonary and Critical Care Medicine [facility] Pager: [phone]  11/19/2013, 7:33 AM

## 2013-11-19 NOTE — Progress Notes (Signed)
UR completed.  Shacoria Latif, RN BSN [facility] CCM Trauma/Neuro ICU Case Manager [phone]  

## 2013-11-19 NOTE — Progress Notes (Signed)
Patients HR continues to flucatates between 90-140's. Notified Dr. Craige Cotta; no new orders given at this time. Will continue to monitor.

## 2013-11-19 NOTE — Progress Notes (Signed)
PT Cancellation Note  Patient Details Name: Todd Vargas MRN: [id number] DOB: 1946-09-26   Cancelled Treatment:    Reason Eval/Treat Not Completed: Patient not medically ready. RN asked to hold. Pt still on strict bed rest as pt with increasing swelling and neurologic decline. PT to return when appropriate.   Marcene Brawn 11/19/2013, 11:18 AM

## 2013-11-19 NOTE — Progress Notes (Signed)
Patient only stating name. He will not answer any other questions. Lethargic but able to follow commands in right upper extremity. Dr. Roseanne Reno notified. Ripley Fraise D

## 2013-11-19 NOTE — Progress Notes (Signed)
12-lead EKG completed; results confirmed A.Fib with RVR. Notified Annie Main, NP of results. Cardizem order initiated at 1443. Will continue to monitor.

## 2013-11-19 NOTE — Progress Notes (Signed)
Stroke Team Progress Note  HISTORY Todd HammersBen Bonanno is an 68 y.o. male --much of history obtained from chart--patient was LSN at 8 pm last night 11/17/2013. HE was found by a friend this morning 11/18/2013 with right eye gaze deviation, left arm hemiplegia and incontinent of stool and urine. CT head obtained on arrival shows a dense right MCA sign and evolving right MCA infarct. Currently patient is in ED, he is able to follow simple commands with his right side, able to say minimal words such as two, five yes but very dysarthric. Patient was not administerd TPA secondary to delay in arrival. Due to risk neurological worsening and impending brain herniation, he was admitted to the neuro ICU for further evaluation and treatment.  SUBJECTIVE No family is at the bedside.  He is no longer talking - RN reports this occurred around 4a. Remains purposeful on the right.  OBJECTIVE Most recent Vital Signs: Filed Vitals:   11/19/13 0502 11/19/13 0600 11/19/13 0613 11/19/13 0700  BP: 149/86 179/102 172/91   Pulse: 82 104 98 76  Temp:      TempSrc:      Resp: 18 30 28 21   Height:      Weight:      SpO2: 97% 97% 96% 96%   CBG (last 3)   Recent Labs  11/18/13 1935 11/19/13 0026 11/19/13 0348  GLUCAP 128* 140* 143*    IV Fluid Intake:   . sodium chloride (hypertonic) 75 mL/hr at 11/19/13 0700    MEDICATIONS  . insulin aspart  0-15 Units Subcutaneous Q4H  . lidocaine   Urethral Once   PRN:  ondansetron (ZOFRAN) IV  Diet:  NPO  Activity:  Bedrest DVT Prophylaxis:  SCDs   CLINICALLY SIGNIFICANT STUDIES Basic Metabolic Panel:  Recent Labs Lab 11/18/13 1139 11/18/13 1155 11/18/13 1500  11/18/13 1845 11/19/13 0200  NA 138 139  --   < > 139 141  K 4.2 4.0  --   --   --   --   CL 101 103  --   --   --   --   CO2 21  --   --   --   --   --   GLUCOSE 180* 183*  --   --   --   --   BUN 7 5*  --   --   --   --   CREATININE 0.49* 0.60 0.58  --   --   --   CALCIUM 8.8  --   --   --   --   --    < > = values in this interval not displayed. Liver Function Tests:  Recent Labs Lab 11/18/13 1139  AST 20  ALT 17  ALKPHOS 136*  BILITOT 0.4  PROT 7.7  ALBUMIN 4.0   CBC:  Recent Labs Lab 11/18/13 1139 11/18/13 1155 11/18/13 1500  WBC 7.3  --  8.7  NEUTROABS 5.5  --   --   HGB 13.3 15.3 12.7*  HCT 40.6 45.0 38.8*  MCV 77.9*  --  78.2  PLT 154  --  179   Coagulation:  Recent Labs Lab 11/18/13 1139  LABPROT 13.1  INR 1.01   Cardiac Enzymes:  Recent Labs Lab 11/18/13 1139  CKTOTAL 127  TROPONINI <0.30   Urinalysis:  Recent Labs Lab 11/19/13 0208  COLORURINE YELLOW  LABSPEC 1.026  PHURINE 6.0  GLUCOSEU NEGATIVE  HGBUR MODERATE*  BILIRUBINUR NEGATIVE  KETONESUR NEGATIVE  PROTEINUR 30*  UROBILINOGEN 0.2  NITRITE NEGATIVE  LEUKOCYTESUR SMALL*   Lipid Panel    Component Value Date/Time   CHOL 148 11/19/2013 0316   TRIG 104 11/19/2013 0316   HDL 39* 11/19/2013 0316   CHOLHDL 3.8 11/19/2013 0316   VLDL 21 11/19/2013 0316   LDLCALC 88 11/19/2013 0316   HgbA1C  No results found for this basename: HGBA1C    Urine Drug Screen:   No results found for this basename: labopia, cocainscrnur, labbenz, amphetmu, thcu, labbarb    Alcohol Level:  Recent Labs Lab 11/18/13 1144  ETH <11    CT of the brain  11/18/2013   1. 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 There is a hyperdense right MCA concerning for thrombus.   CT Angio Head 11/18/2013  Poor visualization of the right carotid terminus concerning for embolus propagating into the proximal right M1 and right A1 segments, however there is reconstitution distally suggesting nonocclusive embolus or early re- cannulization. Mildly attenuated right M2 and M3 appearance which may be in part due to compressive changes from right cerebral edema and evolving right MCA territory infarct. Worsening right cerebral edema, increasing right to left subfalcine herniation.  Complete circle of Willis.     CT Angio Neck  11/18/2013    No hemodynamically significant stenosis.  Suspected mediastinal lymphadenopathy, partially imaged.    MRI of the brain    MRA of the brain  See angio  2D Echocardiogram    Carotid Doppler  Preliminary findings: Left = 1-39% ICA stenosis. Antegrade vertebral flow. Right = very limited views due to patient immobility to turn head. Cannot definitely determine grade of stenosis. No obvious significant stenosis noted.  CXR   11/18/2013    Central line tip at the brachiocephalic venous confluence. No pneumothorax is identified.   11/18/2013  The findings suggest congestive heart failure with pulmonary interstitial and early alveolar edema.     EKG  normal sinus rhythm. For complete results please see formal report.   Therapy Recommendations   Physical Exam   Obese middle aged african [place] male  . Afebrile. Head is nontraumatic. Neck is supple without bruit.  l. Cardiac exam no murmur or gallop. Lungs are clear to auscultation. Distal pulses are well felt. Neurological Exam : Eyes closed. Pupils of 4 mm sluggishly reactive. Right gaze preference but eyes can be moved reflexively to the midline. Does not blink to threat left more than right. Left lower facial weakness. Tongue midline. Patient does not speak but will follow commands on the right side consistently. Flaccid left hemiplegia with no withdrawal to painful stimuli. Purposeful antigravity movements on the right side with good tone and strength. Left plantar upgoing right is downgoing. Decreased sensation on the left. Some left-sided neglect. Gait not tested. ASSESSMENT Todd Vargas is a 68 y.o. male presenting with right eye deviation, left arm hemiplegia and incontinence of stool and urine. Imaging confirms a right MCA infarct with cytotoxic cerebral edema with 5mm midline shift and right to left subfalcine herniation. Placed on hypertonic saline in order to decrease cerebral edema. Infarct felt to be  embolic secondary to unknown source, imaging suggests terminal ICA occlusion.  On no antithrombotics prior to admission. Now on no antithrombotics for secondary stroke prevention. Patient with resultant lethargy, left hemiparesis, mutism, dysphagia, able to follow 1-step commands. Work up underway.   Agitated during the night, treated with Haldol  Dr. Danielle Dess, Neuro Surgery, does not feel decompressive craniotomy is in his best interest  Induced  hypernatremia with 3% saline in order to decrease cerebral edema, Na 141 this am  hypertension Hyperlipidemia, LDL 88, on no statin PTA, now on no statin as NPO, goal LDL < 70 for diabetics Diabetes, HgbA1c pending, goal < 7.0 Obesity, Body mass index is 36.26 kg/(m^2).  Hospital day # 1  TREATMENT/PLAN  Continue ICU level care  Add aspirin 300 mg suppository for secondary stroke prevention.  Increase 3% drip to 100cc/hr. Consider bolus depending on next Na result  CT in am  Follow up stroke work up - 2D, HgbA1c. No MRI at this time.  Family wants full code. I spoke to niece on the phone and gave update and answered questions.  Dr. Pearlean Brownie discussed diagnosis, prognosis,  treatment options and plan of care with great niece (neice, mother of great niece, with recent stroke herself and disabled).    Annie Main, MSN, RN, ANVP-BC, ANP-BC, [facility] Pager: [phone] 11/19/2013 7:35 AM This patient is critically ill and at significant risk of neurological worsening, death and care requires constant monitoring of vital signs, hemodynamics,respiratory and cardiac monitoring,review of multiple databases, neurological assessment, discussion with family, other specialists and medical decision making of high complexity. I spent 40 minutes of neurocritical care time  in the care of  this patient. I have personally obtained a history, examined the patient, evaluated imaging results, and formulated the assessment and plan of care.  I agree with the above. Delia Heady, MD

## 2013-11-20 ENCOUNTER — Inpatient Hospital Stay (HOSPITAL_COMMUNITY): Payer: Medicare Other

## 2013-11-20 ENCOUNTER — Encounter (HOSPITAL_COMMUNITY): Payer: Self-pay | Admitting: Radiology

## 2013-11-20 DIAGNOSIS — I517 Cardiomegaly: Secondary | ICD-10-CM

## 2013-11-20 LAB — GLUCOSE, CAPILLARY
Glucose-Capillary: 122 mg/dL — ABNORMAL HIGH (ref 70–99)
Glucose-Capillary: 136 mg/dL — ABNORMAL HIGH (ref 70–99)
Glucose-Capillary: 139 mg/dL — ABNORMAL HIGH (ref 70–99)
Glucose-Capillary: 140 mg/dL — ABNORMAL HIGH (ref 70–99)
Glucose-Capillary: 141 mg/dL — ABNORMAL HIGH (ref 70–99)
Glucose-Capillary: 144 mg/dL — ABNORMAL HIGH (ref 70–99)
Glucose-Capillary: 147 mg/dL — ABNORMAL HIGH (ref 70–99)

## 2013-11-20 LAB — BASIC METABOLIC PANEL
BUN: 10 mg/dL (ref 6–23)
CO2: 24 meq/L (ref 19–32)
Calcium: 8.8 mg/dL (ref 8.4–10.5)
Chloride: 116 mEq/L — ABNORMAL HIGH (ref 96–112)
Creatinine, Ser: 0.66 mg/dL (ref 0.50–1.35)
GFR calc Af Amer: >90 mL/min (ref >90)
GFR calc non Af Amer: >90 mL/min (ref >90)
GLUCOSE: 161 mg/dL — AB (ref 70–99)
Potassium: 4 mEq/L (ref 3.7–5.3)
SODIUM: 154 meq/L — AB (ref 137–147)

## 2013-11-20 LAB — SODIUM
SODIUM: 155 meq/L — AB (ref 137–147)
SODIUM: 154 meq/L — AB (ref 137–147)
Sodium: 158 mEq/L — ABNORMAL HIGH (ref 137–147)

## 2013-11-20 LAB — CBC
HCT: 41.4 % (ref 39.0–52.0)
HEMOGLOBIN: 13.3 g/dL (ref 13.0–17.0)
MCH: 25.8 pg — ABNORMAL LOW (ref 26.0–34.0)
MCHC: 32.1 g/dL (ref 30.0–36.0)
MCV: 80.4 fL (ref 78.0–100.0)
Platelets: 158 10*3/uL (ref 150–400)
RBC: 5.15 MIL/uL (ref 4.22–5.81)
RDW: 15.5 % (ref 11.5–15.5)
WBC: 9.3 10*3/uL (ref 4.0–10.5)

## 2013-11-20 NOTE — Progress Notes (Signed)
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 Pt lethargic during assessment. Noted pt had Haldol last night for agitation. Pt nonverbal during assessment, inconsistently following commands with RUE and required total assist for mobility and ADL. Feel pt will need SNF for rehab due to significant deficits listed below. Pt will benefit from skilled OT services to facilitate D/C to next venue due to below deficits.  OT Assessment  Patient needs continued OT Services    Follow Up Recommendations  SNF;Supervision/Assistance - 24 hour    Barriers to Discharge  (unsure of family support)    Equipment Recommendations  Other (comment) (TBA by SNF)    Recommendations for Other Services    Frequency  Min 2X/week    Precautions / Restrictions Precautions Precautions: Fall Restrictions Weight Bearing Restrictions: No   Pertinent Vitals/Pain End of session: BP 168/94 sitting O2 97 2L HR 79    ADL  Eating/Feeding: NPO Transfers/Ambulation Related to ADLs: not assessed ADL Comments: total A with all ADL    OT Diagnosis: Generalized weakness;Cognitive deficits;Disturbance of vision;Hemiplegia non-dominant side;Apraxia  OT Problem List: Decreased strength;Decreased range of motion;Decreased activity tolerance;Impaired balance (sitting and/or standing);Impaired vision/perception;Decreased coordination;Decreased cognition;Decreased safety awareness;Decreased knowledge of use of DME or AE;Decreased knowledge of precautions;Impaired sensation;Impaired tone;Obesity;Impaired UE functional use;Increased edema OT Treatment Interventions: Self-care/ADL training;Therapeutic exercise;Neuromuscular education;DME and/or AE  instruction;Therapeutic activities;Cognitive remediation/compensation;Visual/perceptual remediation/compensation;Patient/family education;Balance training   OT Goals(Current goals can be found in the care plan section) Acute Rehab OT Goals Patient Stated Goal: none stated OT Goal Formulation: Patient unable to participate in goal setting Time For Goal Achievement: 12/04/13 Potential to Achieve Goals: Good  Visit Information  Last OT Received On: 11/20/13 Assistance Needed: +2 PT/OT/SLP Co-Evaluation/Treatment: Yes Reason for Co-Treatment: Complexity of the patient's impairments (multi-system involvement);For patient/therapist safety OT goals addressed during session: ADL's and self-care History of Present Illness: 68 yo with 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       Prior Functioning     Home Living Family/patient expects to be discharged to:: Unsure Living Arrangements: Other (Comment) (unsure) Prior Function Level of Independence:  (unsure - no family available`) Communication Communication: Other (comment);Expressive difficulties (pt nonverbal this session) Dominant Hand:  (unsure)         Vision/Perception Vision - History Baseline Vision: Other (comment) (unsure) Patient Visual Report: Other (comment) (unable to state) Vision - Assessment Eye Alignment: Impaired (comment) Vision Assessment: Vision impaired - to be further tested in functional context Additional Comments: R gaze preference. does not track beyond midline. brief tracking to R visual field Perception Perception: Impaired Inattention/Neglect: Does not attend to left visual field;Does not attend to left side of body Praxis Praxis: Impaired Praxis Impairment Details: Initiation;Perseveration;Motor planning   Cognition  Cognition Arousal/Alertness: Lethargic;Suspect due to medications (Pt had Haldol last night) Behavior During Therapy: Flat affect Overall Cognitive Status: Impaired/Different from  baseline Area of Impairment: Following commands;Problem solving;Attention (difficult to assess due to to lethargy) Current Attention Level: Focused Following Commands: Follows one step commands inconsistently;Follows one step commands with increased time Problem Solving: Slow processing;Decreased initiation;Difficulty sequencing;Requires verbal cues;Requires tactile cues    Extremity/Trunk Assessment Upper Extremity Assessment Upper Extremity Assessment: RUE deficits/detail;LUE deficits/detail RUE Deficits / Details: Moving RUE inconsistently to command.. Slow but  purposeful. Using RUE to propr in sitting. LUE Deficits / Details: no movement noted. sever tone proximally. Difficulty ER LUE due to tone. LUE Sensation: decreased light touch LUE Coordination: decreased fine motor;decreased gross motor Lower Extremity Assessment Lower Extremity Assessment: Defer to PT evaluation Cervical / Trunk Assessment Cervical / Trunk Assessment: Other exceptions Cervical / Trunk Exceptions: unable to maintain upright posture. no rightin greactions observed. Posterior and Left lean     Mobility Bed Mobility Overal bed mobility: +2 for physical assistance;Needs Assistance Bed Mobility: Supine to Sit Supine to sit: +2 for physical assistance;Total assist General bed mobility comments: Pt did not assist at all Transfers Overall transfer level: Needs assistance General transfer comment: did not assess sit - stand due to pt not ready     Exercise     Balance Balance Overall balance assessment: Needs assistance Sitting-balance support: Single extremity supported;Feet supported Sitting balance-Leahy Scale: Zero Sitting balance - Comments: no righting reactions Postural control: Posterior lean;Left lateral lean Standing balance support: Single extremity supported Standing balance-Leahy Scale: Zero General Comments General comments (skin integrity, edema, etc.): tongue protruding from mouth. unable to  purse lips   End of Session OT - End of Session Activity Tolerance: Patient limited by lethargy Patient left: in bed;with call bell/phone within reach Nurse Communication: Mobility status;Need for lift equipment (need to use maximove or skylift to get pt OOB)  GO     Kenston Longton,HILLARY 11/20/2013, 12:36 PM [facility], OTR/L  [phone] 11/20/2013

## 2013-11-20 NOTE — Progress Notes (Signed)
PULMONARY / CRITICAL CARE MEDICINE  Name: Todd Vargas MRN: [id number] DOB: [birth year]    ADMISSION DATE:  11/18/2013 CONSULTATION DATE:  11/18/2013  REFERRING MD :  Neurology PRIMARY SERVICE: Neurology  CHIEF COMPLAINT:  Stroke   BRIEF PATIENT DESCRIPTION: 68 yo admitted on 1/26 w/ right MCA CVA. Deficits included: severe dysarthria and left sided neglect. PCCM asked to assess for CVL placement in effort to provide hypertonic saline and out of concern for inability to protect airway / impending respiratory failure.  SIGNIFICANT EVENTS / STUDIES:  1/26  Head CT >>> Evolving right MCA CVA  1/27  Carotid doppler >>> The left vertebral artery appears patent with antegrade flow. Findings consistent with 1-39 percent stenosis involving the left internal carotid artery. Very limited views of the right carotid system due to patient inability to turn head. Cannot determine grade of stenosis. 1/28  Head CT >>> Increased right-to-left subfalcine herniation, now measuring 1 cm, previously 5 mm   LINES / TUBES: L IJ CVL 1/26 >>>  CULTURES:  ANTIBIOTICS:  SUBJECTIVE: AF/RVR overnight, Cardizem started.  Repeated head CT demonstrates increased edema.  Na is at goal, hypertonic saline held.  VITAL SIGNS: Temp:  [98.6 F (37 C)-98.9 F (37.2 C)] 98.6 F (37 C) (01/28 0748) Pulse Rate:  [43-211] 60 (01/28 0900) Resp:  [10-34] 16 (01/28 0900) BP: (109-172)/(63-117) 135/89 mmHg (01/28 0900) SpO2:  [95 %-100 %] 98 % (01/28 0900)  HEMODYNAMICS:   VENTILATOR SETTINGS:   INTAKE / OUTPUT: Intake/Output     01/27 0701 - 01/28 0700 01/28 0701 - 01/29 0700   I.V. (mL/kg) 2625 (19.9) 130 (1)   Total Intake(mL/kg) 2625 (19.9) 130 (1)   Urine (mL/kg/hr) 4175 (1.3) 350 (1.1)   Total Output 4175 350   Net -1550 -220          PHYSICAL EXAMINATION: General:  No respiratory distress Neuro:  L hemiparesis, non verbal HEENT:  PERRL  Cardiovascular:  Irregular, no murmurs Lungs:  CTAB Abdomen:  Soft,  non-tender  Musculoskeletal:  Trace edema Skin:  Intact   LABS:  CBC  Recent Labs Lab 11/18/13 1139 11/18/13 1155 11/18/13 1500 11/20/13 0430  WBC 7.3  --  8.7 9.3  HGB 13.3 15.3 12.7* 13.3  HCT 40.6 45.0 38.8* 41.4  PLT 154  --  179 158   Coag's  Recent Labs Lab 11/18/13 1139 11/18/13 1144  APTT  --  31  INR 1.01  --    BMET  Recent Labs Lab 11/18/13 1139 11/18/13 1155 11/18/13 1500  11/19/13 1320 11/19/13 2120 11/20/13 0430  NA 138 139  --   < > 148* 147 154*  K 4.2 4.0  --   --   --   --  4.0  CL 101 103  --   --   --   --  116*  CO2 21  --   --   --   --   --  24  BUN 7 5*  --   --   --   --  10  CREATININE 0.49* 0.60 0.58  --   --   --  0.66  GLUCOSE 180* 183*  --   --   --   --  161*  < > = values in this interval not displayed. Electrolytes  Recent Labs Lab 11/18/13 1139 11/20/13 0430  CALCIUM 8.8 8.8   Sepsis Markers  Recent Labs Lab 11/18/13 1139  LATICACIDVEN 3.2*   ABG No results found for this  basename: PHART, PCO2ART, PO2ART,  in the last 168 hours  Liver Enzymes  Recent Labs Lab 11/18/13 1139  AST 20  ALT 17  ALKPHOS 136*  BILITOT 0.4  ALBUMIN 4.0   Cardiac Enzymes  Recent Labs Lab 11/18/13 1139  TROPONINI <0.30   Glucose  Recent Labs Lab 11/19/13 1143 11/19/13 1611 11/19/13 2012 11/20/13 0020 11/20/13 0344 11/20/13 0747  GLUCAP 133* 157* 136* 144* 141* 140*   CXR: None today  ASSESSMENT / PLAN:  PULMONARY A:  Impending respiratory failure / at risk for needing airway / mechanical ventilation in setting of worsening cerebral edema. P:   Supplemental oxygen for goal SpO2>92 BiPAP / NIMV contraindicated May need intuibation  CARDIOVASCULAR A:  HTN AF/RVR, now controlled rate P:  BP goal per Neurology ASA Cardizem gtt  RENAL A:  Hypernatremia, induced P:   Trend BMP Hold hypertonic saline, restart for Na < 150  GASTROINTESTINAL A:   Dysphagia GI Px is not indicated P:   NPO    HEMATOLOGIC A:   Anemia No overt hemorrhage P:  Trend CBC SCDs  INFECTIOUS A:   No acute issues P:   Monitor off abx  ENDOCRINE A:   DM II P:   SSI  NEUROLOGIC A:   Right MCA CVA  Acute encephalopathy P:   Neurology following Hypertonic saline as above TTE  I have personally obtained history, examined patient, evaluated and interpreted laboratory and imaging results, reviewed medical records, formulated assessment / plan and placed orders.  CRITICAL CARE:  The patient is critically ill with multiple organ systems failure and requires high complexity decision making for assessment and support, frequent evaluation and titration of therapies, application of advanced monitoring technologies and extensive interpretation of multiple databases. Critical Care Time devoted to patient care services described in this note is 35 minutes.   Lonia FarberZUBELEVITSKIY, Kevante Lunt, MD Pulmonary and Critical Care Medicine [facility] Pager: [phone]  11/20/2013, 9:20 AM

## 2013-11-20 NOTE — Evaluation (Signed)
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 Pt with no functional mvmt of L UE/LE. Pt with inconsistent command follow with R UE/LE. Recommending SNF once medically stable for con't functional recovery.    PT Assessment  Patient needs continued PT services    Follow Up Recommendations  SNF;Supervision/Assistance - 24 hour    Does the patient have the potential to tolerate intense rehabilitation      Barriers to Discharge        Equipment Recommendations   (TBD)    Recommendations for Other Services     Frequency Min 3X/week    Precautions / Restrictions Precautions Precautions: Fall Restrictions Weight Bearing Restrictions: No   Pertinent Vitals/Pain Vitals stable      Mobility  Bed Mobility Overal bed mobility: +2 for physical assistance;Needs Assistance Bed Mobility: Supine to Sit Supine to sit: +2 for physical assistance;Total assist General bed mobility comments: Pt did not assist at all Transfers Overall transfer level: Needs assistance General transfer comment: did not assess sit - stand due to pt not ready    Exercises     PT Diagnosis: Hemiplegia non-dominant side  PT Problem List: Decreased strength;Decreased activity tolerance;Decreased balance;Decreased mobility;Decreased coordination PT Treatment Interventions: DME instruction;Gait training;Functional mobility training;Therapeutic activities;Therapeutic exercise;Balance training;Neuromuscular re-education     PT Goals(Current goals can be found in the care plan section) Acute Rehab PT Goals Patient Stated Goal: none stated PT Goal Formulation: Patient unable to participate in goal setting Time For Goal Achievement:  12/04/13 Potential to Achieve Goals: Fair  Visit Information  Last PT Received On: 11/20/13 Assistance Needed: +2 PT/OT/SLP Co-Evaluation/Treatment: Yes Reason for Co-Treatment: Complexity of the patient's impairments (multi-system involvement);Necessary to address cognition/behavior during functional activity;For patient/therapist safety PT goals addressed during session: Mobility/safety with mobility OT goals addressed during session: ADL's and self-care History of Present Illness: 68 yo with 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       Prior Functioning  Home Living Family/patient expects to be discharged to:: Unsure Living Arrangements: Other (Comment) (unsure) Additional Comments: no family avail and pt non-verbal Prior Function Level of Independence:  (unsure famly unavail and pt non-verbal) Communication Communication: Expressive difficulties (pt non-verbal this session) Dominant Hand:  (unsure)    Cognition  Cognition Arousal/Alertness: Lethargic (pt received haldol last night) Behavior During Therapy: Flat affect Overall Cognitive Status: Impaired/Different from baseline Area of Impairment: Following commands;Problem solving;Attention Current Attention Level: Focused Following Commands: Follows one step commands inconsistently;Follows one step commands with increased time (with R UE/LE no command follow on L) Problem Solving: Slow processing;Decreased initiation;Difficulty sequencing;Requires verbal cues;Requires tactile cues    Extremity/Trunk Assessment Upper Extremity Assessment Upper Extremity Assessment: Defer to OT evaluation RUE Deficits / Details: Moving RUE inconsistently to command.. Slow but purposeful. Using RUE to propr in sitting. LUE Deficits / Details: no movement noted. sever tone proximally. Difficulty ER LUE due to tone. LUE Sensation: decreased light touch LUE Coordination: decreased fine motor;decreased gross motor Lower Extremity Assessment Lower  Extremity Assessment: LLE deficits/detail LLE Deficits / Details: no mvmt, demo's some tone, + facial grimace to noxious stimuli on L LE Cervical / Trunk Assessment Cervical / Trunk Assessment: Other exceptions Cervical / Trunk Exceptions: unable to maintain upright posture. no rightin  greactions observed. Posterior and Left lean   Balance Balance Overall balance assessment: Needs assistance Sitting-balance support: Single extremity supported Sitting balance-Leahy Scale: Zero Sitting balance - Comments: no righting reactions Postural control: Posterior lean;Left lateral lean Standing balance support: Single extremity supported Standing balance-Leahy Scale: Zero General Comments General comments (skin integrity, edema, etc.): tongue protruding from mouth. unable to purse lips  End of Session PT - End of Session Activity Tolerance: Patient limited by fatigue;Patient limited by lethargy Patient left: in bed;with call bell/phone within reach Nurse Communication: Mobility status  GP     Todd Vargas, Todd Vargas 11/20/2013, 1:37 PM  Lewis ShockAshly Betzayda Braxton, PT, DPT Pager #: [phone] Office #: [phone]

## 2013-11-20 NOTE — Progress Notes (Signed)
Critical Head CT result read to Dr. Roseanne Reno. No new orders given. Ripley Fraise D

## 2013-11-20 NOTE — Progress Notes (Signed)
SLP Cancellation Note  Patient Details Name: Todd Vargas MRN: [id number] DOB: 1946/05/08   Cancelled treatment:        SLP provided total verbal/tactile/visual cues for swallow assessment, however pt. extremely somnolent without response to stimulation.  Will continue efforts for swallow and speech-cognitive assessment.   Breck Coons [place].Ed [employer] [phone]  11/20/2013

## 2013-11-20 NOTE — Progress Notes (Signed)
  Echocardiogram 2D Echocardiogram has been performed.  Todd Vargas 11/20/2013, 4:47 PM

## 2013-11-20 NOTE — Progress Notes (Signed)
Stroke Team Progress Note  HISTORY Todd Vargas is an 68 y.o. male --much of history obtained from chart--patient was LSN at 8 pm last night 11/17/2013. HE was found by a friend this morning 11/18/2013 with right eye gaze deviation, left arm hemiplegia and incontinent of stool and urine. CT head obtained on arrival shows a dense right MCA sign and evolving right MCA infarct. Currently patient is in ED, he is able to follow simple commands with his right side, able to say minimal words such as two, five yes but very dysarthric. Patient was not administerd TPA secondary to delay in arrival. Due to risk neurological worsening and impending brain herniation, he was admitted to the neuro ICU for further evaluation and treatment.  SUBJECTIVE No family at bedside.  OBJECTIVE Most recent Vital Signs: Filed Vitals:   11/20/13 0345 11/20/13 0400 11/20/13 0500 11/20/13 0600  BP:  140/71 119/64 156/82  Pulse:  85 68 87  Temp: 98.7 F (37.1 C)     TempSrc: Axillary     Resp:  18 19 21   Height:      Weight:      SpO2:  96% 99% 97%   CBG (last 3)   Recent Labs  11/19/13 2012 11/20/13 0020 11/20/13 0344  GLUCAP 136* 144* 141*    IV Fluid Intake:   . diltiazem (CARDIZEM) infusion 15 mg/hr (11/20/13 0600)  . sodium chloride (hypertonic) 100 mL/hr at 11/20/13 0606    MEDICATIONS  . antiseptic oral rinse  15 mL Mouth Rinse BID  . aspirin  300 mg Rectal Daily  . insulin aspart  0-15 Units Subcutaneous Q4H  . lidocaine   Urethral Once   PRN:  ondansetron (ZOFRAN) IV  Diet:  NPO  Activity:  Bedrest DVT Prophylaxis:  SCDs   CLINICALLY SIGNIFICANT STUDIES Basic Metabolic Panel:  Recent Labs Lab 11/18/13 1139 11/18/13 1155 11/18/13 1500  11/19/13 2120 11/20/13 0430  NA 138 139  --   < > 147 154*  K 4.2 4.0  --   --   --  4.0  CL 101 103  --   --   --  116*  CO2 21  --   --   --   --  24  GLUCOSE 180* 183*  --   --   --  161*  BUN 7 5*  --   --   --  10  CREATININE 0.49* 0.60 0.58  --    --  0.66  CALCIUM 8.8  --   --   --   --  8.8  < > = values in this interval not displayed. Liver Function Tests:   Recent Labs Lab 11/18/13 1139  AST 20  ALT 17  ALKPHOS 136*  BILITOT 0.4  PROT 7.7  ALBUMIN 4.0   CBC:  Recent Labs Lab 11/18/13 1139  11/18/13 1500 11/20/13 0430  WBC 7.3  --  8.7 9.3  NEUTROABS 5.5  --   --   --   HGB 13.3  < > 12.7* 13.3  HCT 40.6  < > 38.8* 41.4  MCV 77.9*  --  78.2 80.4  PLT 154  --  179 158  < > = values in this interval not displayed. Coagulation:   Recent Labs Lab 11/18/13 1139  LABPROT 13.1  INR 1.01   Cardiac Enzymes:   Recent Labs Lab 11/18/13 1139  CKTOTAL 127  TROPONINI <0.30   Urinalysis:   Recent Labs Lab 11/19/13 0208  COLORURINE YELLOW  LABSPEC 1.026  PHURINE 6.0  GLUCOSEU NEGATIVE  HGBUR MODERATE*  BILIRUBINUR NEGATIVE  KETONESUR NEGATIVE  PROTEINUR 30*  UROBILINOGEN 0.2  NITRITE NEGATIVE  LEUKOCYTESUR SMALL*   Lipid Panel    Component Value Date/Time   CHOL 148 11/19/2013 0316   TRIG 104 11/19/2013 0316   HDL 39* 11/19/2013 0316   CHOLHDL 3.8 11/19/2013 0316   VLDL 21 11/19/2013 0316   LDLCALC 88 11/19/2013 0316   HgbA1C  Lab Results  Component Value Date   HGBA1C 6.9* 11/19/2013    Urine Drug Screen:   No results found for this basename: labopia,  cocainscrnur,  labbenz,  amphetmu,  thcu,  labbarb    Alcohol Level:   Recent Labs Lab 11/18/13 1144  ETH <11    CT of the brain   11/20/2013 . Continued interval evolution of large right MCA territory ischemic infarct with increased right-to-left subfalcine herniation, now measuring 1 cm, previously 5 mm on 11/18/2013. Asymmetric enlargement of the left lateral ventricle is slightly increased, worrisome for possible developing entrapment. 2. No evidence of hemorrhagic conversion.  11/18/2013   1. 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 There is a hyperdense right MCA concerning for thrombus.   CT Angio Head  11/18/2013  Poor visualization of the right carotid terminus concerning for embolus propagating into the proximal right M1 and right A1 segments, however there is reconstitution distally suggesting nonocclusive embolus or early re- cannulization. Mildly attenuated right M2 and M3 appearance which may be in part due to compressive changes from right cerebral edema and evolving right MCA territory infarct. Worsening right cerebral edema, increasing right to left subfalcine herniation.  Complete circle of Willis.    CT Angio Neck  11/18/2013    No hemodynamically significant stenosis.  Suspected mediastinal lymphadenopathy, partially imaged.    MRI of the brain    MRA of the brain  See angio  2D Echocardiogram    Carotid Doppler  Preliminary findings: Left = 1-39% ICA stenosis. Antegrade vertebral flow. Right = very limited views due to patient immobility to turn head. Cannot definitely determine grade of stenosis. No obvious significant stenosis noted.  CXR   11/19/2013 Persistent pulmonary infiltrates question pulmonary edema, little changed. 11/18/2013    Central line tip at the brachiocephalic venous confluence. No pneumothorax is identified.   11/18/2013  The findings suggest congestive heart failure with pulmonary interstitial and early alveolar edema.     EKG  normal sinus rhythm. For complete results please see formal report.   Therapy Recommendations   Physical Exam   Obese middle aged african [place] male  . Afebrile. Head is nontraumatic. Neck is supple without bruit.  l. Cardiac exam no murmur or gallop. Lungs are clear to auscultation. Distal pulses are well felt. Neurological Exam : Eyes closed. Pupils of 4 mm sluggishly reactive. Right gaze preference but eyes can be moved reflexively to the midline. Does not blink to threat left more than right. Left lower facial weakness. Tongue midline. Patient does not speak but will follow commands on the right side consistently. Flaccid left  hemiplegia with no withdrawal to painful stimuli. Purposeful antigravity movements on the right side with good tone and strength. Left plantar upgoing right is downgoing. Decreased sensation on the left. Some left-sided neglect. Gait not tested.  ASSESSMENT Todd Vargas is a 68 y.o. male presenting with right eye deviation, left arm hemiplegia and incontinence of stool and urine. Imaging confirms a right MCA infarct with cytotoxic cerebral edema, increasing  overnight from 5mm ->10 mm midline shift and right to left subfalcine herniation, concern for potential ventricle entrapment. On hypertonic saline in order to decrease cerebral edema. Infarct felt to be embolic secondary to terminal ICA occlusion from new onset atrial fibrillation.  On no antithrombotics prior to admission. Now on aspirin 300 mg suppository daily for secondary stroke prevention. Patient with resultant lethargy, left hemiparesis, mutism, dysphagia, able to follow 1-step commands. Work up underway.   Agitated during the night, treated with Haldol  Dr. Danielle Dess, Neuro Surgery, does not feel decompressive craniotomy is in his best interest  Induced hypernatremia with 3% saline in order to decrease cerebral edema, Na 154 this am  Atrial Fibrillation - new onset yesterday, not an oral anticoagulation candidate at this time due to NPO status  CHA2DS2-VASc Score for Atrial Fibrillation Stroke Risk = 5  (?2 oral anticoagulation recommended);  Yearly Stroke risk:  6.7%   Age in Years:  55-74   +1    Sex:  Male   0    Congestive Heart Failure History:  No  0    Hypertension History:  yes   +1    Stroke/TIA/Thromboembolism History:  yes   +2   Vascular Disease History:  No  0    Diabetes Mellitus:  yes   +1 hypertension Hyperlipidemia, LDL 88, on no statin PTA, now on no statin as NPO, goal LDL < 70 for diabetics Diabetes, HgbA1c 6.9, goal < 7.0 Obesity, Body mass index is 36.26 kg/(m^2).  Family has opted for no intubation - made a  limited DNR during the night  Hospital day # 2  TREATMENT/PLAN  Continue ICU level care  Continue  aspirin 300 mg suppository for secondary stroke prevention.  Turn off 3% as Na will likely continue to rise. Goal 150-155. Will continue protocol and follow Na q 6h. Turn back on if level drops less than 150.  Follow up stroke work up - 2D  Consider tube feeding tomorrow (concerned for placing panda in setting of tenuous respiratory status)  Dr. Pearlean Brownie discussed diagnosis, prognosis,  treatment options and plan of care with RN and CCM.Marland Kitchen    Annie Main, MSN, RN, ANVP-BC, ANP-BC, GNP-BC [facility] Pager: [phone] 11/20/2013 7:50 AM This patient is critically ill and at significant risk of neurological worsening, death and care requires constant monitoring of vital signs, hemodynamics,respiratory and cardiac monitoring,review of multiple databases, neurological assessment, discussion with family, other specialists and medical decision making of high complexity. I spent 30 minutes of neurocritical care time  in the care of  this patient. I have personally obtained a history, examined the patient, evaluated imaging results, and formulated the assessment and plan of care. I agree with the above.  Delia Heady, MD

## 2013-11-21 ENCOUNTER — Inpatient Hospital Stay (HOSPITAL_COMMUNITY): Payer: Medicare Other

## 2013-11-21 LAB — BASIC METABOLIC PANEL
BUN: 14 mg/dL (ref 6–23)
CALCIUM: 9.2 mg/dL (ref 8.4–10.5)
CO2: 26 mEq/L (ref 19–32)
CREATININE: 0.7 mg/dL (ref 0.50–1.35)
Chloride: 113 mEq/L — ABNORMAL HIGH (ref 96–112)
Glucose, Bld: 153 mg/dL — ABNORMAL HIGH (ref 70–99)
POTASSIUM: 3.3 meq/L — AB (ref 3.7–5.3)
Sodium: 152 mEq/L — ABNORMAL HIGH (ref 137–147)

## 2013-11-21 LAB — GLUCOSE, CAPILLARY
GLUCOSE-CAPILLARY: 124 mg/dL — AB (ref 70–99)
GLUCOSE-CAPILLARY: 158 mg/dL — AB (ref 70–99)
Glucose-Capillary: 158 mg/dL — ABNORMAL HIGH (ref 70–99)
Glucose-Capillary: 134 mg/dL — ABNORMAL HIGH (ref 70–99)
Glucose-Capillary: 148 mg/dL — ABNORMAL HIGH (ref 70–99)
Glucose-Capillary: 129 mg/dL — ABNORMAL HIGH (ref 70–99)

## 2013-11-21 LAB — CBC
HEMATOCRIT: 43.2 % (ref 39.0–52.0)
Hemoglobin: 13.7 g/dL (ref 13.0–17.0)
MCH: 25.8 pg — AB (ref 26.0–34.0)
MCHC: 31.7 g/dL (ref 30.0–36.0)
MCV: 81.4 fL (ref 78.0–100.0)
Platelets: 156 10*3/uL (ref 150–400)
RBC: 5.31 MIL/uL (ref 4.22–5.81)
RDW: 15.7 % — AB (ref 11.5–15.5)
WBC: 10.2 10*3/uL (ref 4.0–10.5)

## 2013-11-21 LAB — SODIUM
SODIUM: 157 meq/L — AB (ref 137–147)
Sodium: 156 mEq/L — ABNORMAL HIGH (ref 137–147)
Sodium: 155 mEq/L — ABNORMAL HIGH (ref 137–147)

## 2013-11-21 MED ORDER — IRBESARTAN 75 MG PO TABS
75.0000 mg | ORAL_TABLET | Freq: Every day | ORAL | Status: DC
  Administered 2013-11-21 – 2013-11-30 (×10): 75 mg via ORAL
  Filled 2013-11-21 (×10): qty 1

## 2013-11-21 MED ORDER — PRO-STAT SUGAR FREE PO LIQD
30.0000 mL | Freq: Every day | ORAL | Status: AC
  Administered 2013-11-21 – 2013-11-27 (×30): 30 mL
  Filled 2013-11-21 (×34): qty 30

## 2013-11-21 MED ORDER — CARVEDILOL 3.125 MG PO TABS
3.1250 mg | ORAL_TABLET | Freq: Two times a day (BID) | ORAL | Status: DC
  Administered 2013-11-21 – 2013-11-30 (×17): 3.125 mg via ORAL
  Filled 2013-11-21 (×20): qty 1

## 2013-11-21 MED ORDER — POTASSIUM CHLORIDE 10 MEQ/100ML IV SOLN
10.0000 meq | INTRAVENOUS | Status: AC
Start: 2013-11-21 — End: 2013-11-21
  Administered 2013-11-21 (×4): 10 meq via INTRAVENOUS
  Filled 2013-11-21 (×4): qty 100

## 2013-11-21 MED ORDER — ENOXAPARIN SODIUM 40 MG/0.4ML ~~LOC~~ SOLN
40.0000 mg | Freq: Every day | SUBCUTANEOUS | Status: DC
  Administered 2013-11-21 – 2013-11-29 (×9): 40 mg via SUBCUTANEOUS
  Filled 2013-11-21 (×9): qty 0.4

## 2013-11-21 MED ORDER — OSMOLITE 1.5 CAL PO LIQD
1000.0000 mL | ORAL | Status: AC
  Administered 2013-11-21 – 2013-11-27 (×5): 1000 mL
  Filled 2013-11-21 (×11): qty 1000

## 2013-11-21 NOTE — Evaluation (Signed)
Clinical/Bedside Swallow Evaluation Patient Details  Name: Todd Vargas MRN: [id number] Date of Birth: 12-21-1945  Today's Date: 11/21/2013 Time: 0141-0301 SLP Time Calculation (min): 10 min  Past Medical History:  Past Medical History  Diagnosis Date  . Diabetes mellitus without complication   . HTN (hypertension)   . Hyperlipidemia    Past Surgical History: History reviewed. No pertinent past surgical history. HPI:  68 yo admitted on 1/26 w/ history of DM, HTN.  CT revealed continued interval evolution of large right MCA territory ischemic infarct with increased right-to-left subfalcine herniation, now measuring 1 cm, previously 5 mm on 11/18/2013. Deficits included: severe dysarthria and left sided neglect. PCCM asked to assess for CVL placement in effort to provide hypertonic saline and out of concern for inability to protect airway / impending respiratory failure.  Pt. did not require intubation.   Assessment / Plan / Recommendation Clinical Impression  Pt. continues with somnolence, no command following for this SLP (following several for RN), keeps eyes closed; unable to arouse for this SLP with max tactile and verbal stimuli.  No attempts to manipulate pureed texture with max cues and suctioned from oral cavity.  Pt. should continue NPO status with frequent oral care with non-oral means of nutrition.  ST will continue to follow for increased arousal/awareness and ability to consume po's.     Aspiration Risk  Severe    Diet Recommendation NPO;Alternative means - temporary        Other  Recommendations Oral Care Recommendations: Oral care Q4 per protocol   Follow Up Recommendations  Skilled Nursing facility    Frequency and Duration min 2x/week  2 weeks   Pertinent Vitals/Pain WDL        Swallow Study         Oral/Motor/Sensory Function Overall Oral Motor/Sensory Function:  (generalized weakness)   Ice Chips Ice chips: Not tested   Thin Liquid Thin Liquid: Not  tested    Nectar Thick Nectar Thick Liquid: Not tested   Honey Thick Honey Thick Liquid: Not tested   Puree Puree: Impaired Presentation: Spoon Oral Phase Impairments: Reduced labial seal;Reduced lingual movement/coordination;Poor awareness of bolus Oral Phase Functional Implications: Oral holding;Oral residue Pharyngeal Phase Impairments:  (not initiated)   Solid   GO    Solid: Not tested       Royce Macadamia M.Ed [employer] [phone]  11/21/2013

## 2013-11-21 NOTE — Progress Notes (Signed)
INITIAL NUTRITION ASSESSMENT  DOCUMENTATION CODES Per approved criteria  -Obesity Unspecified   INTERVENTION:  1. Initiate Osmolite 1.5 @ 20 ml/hr and increase by 10 ml every 4 hours to goal rate of 50 ml/hr.   2. 30 ml Prostat five times per day .  At goal rate, tube feeding regimen will provide 2300 kcal, 150 grams of protein, and 914 ml of H2O.    NUTRITION DIAGNOSIS: Inadequate oral intake related to inability to eat as evidenced by NPO status.  Goal: Pt to meet >/= 90% of their estimated nutrition needs   Monitor:  TF initiation and tolerance, weight trend, labs  Reason for Assessment: Consult received to initiate and manage enteral nutrition support.  68 y.o. male  Admitting Dx: <principal problem not specified>  ASSESSMENT: Pt admitted with right MCA infarct with cytotoxic cerebral edema, midline shift increasing. Pt on hypertonic saline which is currently held for Na level. Per MD note infarct felt to be embolic secondary to terminal ICA occlusion from new onset atrial fibrillation.  Pt failed swallow evaluation, order received to begin enteral nutrition.  Pt discussed during ICU rounds and with RN.   Nutrition Focused Physical Exam:  Subcutaneous Fat:  Orbital Region: WNL Upper Arm Region: WNL Thoracic and Lumbar Region: WNL  Muscle:  Temple Region: WNL Clavicle Bone Region: WNL Clavicle and Acromion Bone Region: WNL Scapular Bone Region: WNL Dorsal Hand: WNL Patellar Region: WNL Anterior Thigh Region: WNL Posterior Calf Region: WNL  Edema: not present   Height: Ht Readings from Last 1 Encounters:  11/18/13 6\' 3"  (1.905 m)    Weight: Wt Readings from Last 1 Encounters:  11/18/13 290 lb 2 oz (131.6 kg)    Ideal Body Weight: 89 kg  % Ideal Body Weight: 148%  Wt Readings from Last 10 Encounters:  11/18/13 290 lb 2 oz (131.6 kg)    Usual Body Weight: unknown  % Usual Body Weight: -  BMI:  Body mass index is 36.26 kg/(m^2).  Estimated  Nutritional Needs: Kcal: 2100-2300 Protein: 140-160 Fluid: > 2 L/day  Skin: no issues noted  Diet Order: NPO  EDUCATION NEEDS: -No education needs identified at this time   Intake/Output Summary (Last 24 hours) at 11/21/13 1118 Last data filed at 11/21/13 0900  Gross per 24 hour  Intake    530 ml  Output   1935 ml  Net  -1405 ml    Last BM: PTA   Labs:   Recent Labs Lab 11/18/13 1139 11/18/13 1155 11/18/13 1500  11/20/13 0430  11/20/13 2130 11/21/13 0315 11/21/13 0920  NA 138 139  --   < > 154*  < > 154* 152* 156*  K 4.2 4.0  --   --  4.0  --   --  3.3*  --   CL 101 103  --   --  116*  --   --  113*  --   CO2 21  --   --   --  24  --   --  26  --   BUN 7 5*  --   --  10  --   --  14  --   CREATININE 0.49* 0.60 0.58  --  0.66  --   --  0.70  --   CALCIUM 8.8  --   --   --  8.8  --   --  9.2  --   GLUCOSE 180* 183*  --   --  161*  --   --  153*  --   < > = values in this interval not displayed.  CBG (last 3)   Recent Labs  11/20/13 2337 11/21/13 0411 11/21/13 0725  GLUCAP 122* 124* 158*    Scheduled Meds: . antiseptic oral rinse  15 mL Mouth Rinse BID  . aspirin  300 mg Rectal Daily  . carvedilol  3.125 mg Oral BID WC  . enoxaparin (LOVENOX) injection  40 mg Subcutaneous Daily  . insulin aspart  0-15 Units Subcutaneous Q4H  . irbesartan  75 mg Oral Daily  . lidocaine   Urethral Once    Continuous Infusions: . diltiazem (CARDIZEM) infusion 15 mg/hr (11/21/13 1004)    Past Medical History  Diagnosis Date  . Diabetes mellitus without complication   . HTN (hypertension)   . Hyperlipidemia     History reviewed. No pertinent past surgical history.  Kendell BaneHeather Coley Kulikowski RD, LDN, CNSC [phone] Pager [phone] After Hours Pager

## 2013-11-21 NOTE — Evaluation (Signed)
Speech Language Pathology Evaluation Patient Details Name: Todd Vargas MRN: [id number] DOB: 01-26-1946 Today's Date: 11/21/2013 Time: 2585-2778 SLP Time Calculation (min): 10 min  Problem List:  Patient Active Problem List   Diagnosis Date Noted  . CVA (cerebral infarction) 11/18/2013  . Encephalopathy acute 11/18/2013  . Dysphagia 11/18/2013  . Anemia 11/18/2013   Past Medical History:  Past Medical History  Diagnosis Date  . Diabetes mellitus without complication   . HTN (hypertension)   . Hyperlipidemia    Past Surgical History: History reviewed. No pertinent past surgical history. HPI:  68 yo admitted on 1/26 w/ history of DM, HTN.  CT revealed continued interval evolution of large right MCA territory ischemic infarct with increased right-to-left subfalcine herniation, now measuring 1 cm, previously 5 mm on 11/18/2013. Deficits included: severe dysarthria and left sided neglect. PCCM asked to assess for CVL placement in effort to provide hypertonic saline and out of concern for inability to protect airway / impending respiratory failure.  Pt. did not require intubation.   Assessment / Plan / Recommendation Clinical Impression  Pt. extremely lethargic this a.m. exhibiting cognitive impairments for basic functions including poor arousal, focused attention and awareness.  SLP will treat pt. for facilitation of cognitive-communicative abilities.     SLP Assessment  Patient needs continued Speech Lanaguage Pathology Services    Follow Up Recommendations  Skilled Nursing facility    Frequency and Duration min 2x/week  2 weeks   Pertinent Vitals/Pain WDL   SLP Goals  SLP Goals Potential to Achieve Goals: Good Potential Considerations: Severity of impairments  SLP Evaluation Prior Functioning  Cognitive/Linguistic Baseline: Information not available   Cognition  Overall Cognitive Status: Impaired/Different from baseline Arousal/Alertness: Lethargic Orientation Level:   (TBA) Attention: Focused Focused Attention: Impaired Focused Attention Impairment: Verbal basic Memory:  (TBA) Awareness: Impaired Awareness Impairment: Emergent impairment;Intellectual impairment Problem Solving: Impaired Problem Solving Impairment: Verbal basic Safety/Judgment: Impaired    Comprehension  Auditory Comprehension Overall Auditory Comprehension:  (TBA, observed pt. follow 2 verbal commands from RN) Interfering Components: Attention Visual Recognition/Discrimination Discrimination: Not tested Reading Comprehension Reading Status: Unable to assess (comment)    Expression Expression Primary Mode of Expression:  (no attempts this eval) Verbal Expression Overall Verbal Expression:  (TBA) Written Expression Written Expression: Unable to assess (comment)   Oral / Motor Oral Motor/Sensory Function Overall Oral Motor/Sensory Function:  (TBA) Motor Speech Overall Motor Speech:  (TBA) Motor Planning:  (TBA)   GO     Breck Coons Mariana Goytia M.Ed [employer] [phone]  11/21/2013

## 2013-11-21 NOTE — Progress Notes (Signed)
eLink Physician-Brief Progress Note Patient Name: Todd Vargas DOB: [birth year] MRN: [id number]  Date of Service  11/21/2013   HPI/Events of Note     eICU Interventions  Hypokalemia, repleted    Intervention Category Minor Interventions: Electrolytes abnormality - evaluation and management  [name] 11/21/2013, 5:28 AM

## 2013-11-21 NOTE — Progress Notes (Signed)
PULMONARY / CRITICAL CARE MEDICINE  Name: Todd Vargas MRN: [id number] DOB: 12/15/1945    ADMISSION DATE:  11/18/2013 CONSULTATION DATE:  11/18/2013  REFERRING MD :  Neurology PRIMARY SERVICE: Neurology  CHIEF COMPLAINT:  Stroke   BRIEF PATIENT DESCRIPTION: 68 yo admitted on 1/26 w/ right MCA CVA. Deficits included: severe dysarthria and left sided neglect. PCCM asked to assess for CVL placement in effort to provide hypertonic saline and out of concern for inability to protect airway / impending respiratory failure.  SIGNIFICANT EVENTS / STUDIES:  1/26  Head CT >>> Evolving right MCA CVA  1/27  Carotid doppler >>> The left vertebral artery appears patent with antegrade flow. Findings consistent with 1-39 percent stenosis involving the left internal carotid artery. Very limited views of the right carotid system due to patient inability to turn head. Cannot determine grade of stenosis. 1/27  Code status changed to partial >>> No intubation / No CPR 1/28  Head CT >>> Increased right-to-left subfalcine herniation, now measuring 1 cm, previously 5 mm  1/28  TTE >>> EF 30, diffuse hypokinesis, L/R atria severely dilated  LINES / TUBES: L IJ CVL 1/26 >>>  CULTURES:  ANTIBIOTICS:  SUBJECTIVE: No acute events.  VITAL SIGNS: Temp:  [97.5 F (36.4 C)-98.7 F (37.1 C)] 97.5 F (36.4 C) (01/29 0439) Pulse Rate:  [43-113] 66 (01/29 0700) Resp:  [5-32] 23 (01/29 0700) BP: (118-165)/(57-103) 149/103 mmHg (01/29 0700) SpO2:  [93 %-100 %] 96 % (01/29 0700) FiO2 (%):  [2 %] 2 % (01/28 1700)  HEMODYNAMICS:   VENTILATOR SETTINGS: Vent Mode:  [-]  FiO2 (%):  [2 %] 2 %  INTAKE / OUTPUT: Intake/Output     01/28 0701 - 01/29 0700 01/29 0701 - 01/30 0700   I.V. (mL/kg) 460 (3.5)    IV Piggyback 200    Total Intake(mL/kg) 660 (5)    Urine (mL/kg/hr) 2635 (0.8)    Total Output 2635     Net -1975            PHYSICAL EXAMINATION: General:  Resting comfortable Neuro:  L hemiparesis, non  verba HEENT:  Tongue protruding Cardiovascular:  Irregular, no murmurs Lungs:  Bilateral clear air entry Abdomen:  Soft, non-tender  Musculoskeletal:  Trace edema Skin:  Intact   LABS:  CBC  Recent Labs Lab 11/18/13 1500 11/20/13 0430 11/21/13 0315  WBC 8.7 9.3 10.2  HGB 12.7* 13.3 13.7  HCT 38.8* 41.4 43.2  PLT 179 158 156   Coag's  Recent Labs Lab 11/18/13 1139 11/18/13 1144  APTT  --  31  INR 1.01  --    BMET  Recent Labs Lab 11/18/13 1139 11/18/13 1155 11/18/13 1500  11/20/13 0430  11/20/13 1506 11/20/13 2130 11/21/13 0315  NA 138 139  --   < > 154*  < > 155* 154* 152*  K 4.2 4.0  --   --  4.0  --   --   --  3.3*  CL 101 103  --   --  116*  --   --   --  113*  CO2 21  --   --   --  24  --   --   --  26  BUN 7 5*  --   --  10  --   --   --  14  CREATININE 0.49* 0.60 0.58  --  0.66  --   --   --  0.70  GLUCOSE 180* 183*  --   --  161*  --   --   --  153*  < > = values in this interval not displayed. Electrolytes  Recent Labs Lab 11/18/13 1139 11/20/13 0430 11/21/13 0315  CALCIUM 8.8 8.8 9.2   Sepsis Markers  Recent Labs Lab 11/18/13 1139  LATICACIDVEN 3.2*   ABG No results found for this basename: PHART, PCO2ART, PO2ART,  in the last 168 hours  Liver Enzymes  Recent Labs Lab 11/18/13 1139  AST 20  ALT 17  ALKPHOS 136*  BILITOT 0.4  ALBUMIN 4.0   Cardiac Enzymes  Recent Labs Lab 11/18/13 1139  TROPONINI <0.30   Glucose  Recent Labs Lab 11/20/13 0747 11/20/13 1208 11/20/13 1610 11/20/13 2032 11/20/13 2337 11/21/13 0411  GLUCAP 140* 136* 147* 139* 122* 124*   CXR: None today  ASSESSMENT / PLAN:  PULMONARY A:  Impending respiratory failure / at risk for needing airway / mechanical ventilation in setting of worsening cerebral edema. P:   Supplemental oxygen for goal SpO2>92 BiPAP / NIMV contraindicated DNI  CARDIOVASCULAR A:  HTN AF/RVR, now controlled rate Acute ( on chronic ? ) congestive heart  failure P:  BP goal per Neurology ASA Cardizem gtt DNR Add Coreg 3.125 / Avapro 75 DNR  RENAL A:  Hypernatremia, induced Hypokalemia P:   Trend BMP K 10 x 4 Hold hypertonic saline, restart for Na < 150  GASTROINTESTINAL A:   Dysphagia GI Px is not indicated P:   NPO  Place feeding tube TF per Nutritionist  HEMATOLOGIC A:   Anemia No overt hemorrhage P:  Trend CBC SCDs  INFECTIOUS A:   No acute issues P:   Monitor off abx  ENDOCRINE A:   DM II P:   SSI  NEUROLOGIC A:   Right MCA CVA  Acute encephalopathy P:   Neurology following Hypertonic saline as above  I have personally obtained history, examined patient, evaluated and interpreted laboratory and imaging results, reviewed medical records, formulated assessment / plan and placed orders.  CRITICAL CARE:  The patient is critically ill with multiple organ systems failure and requires high complexity decision making for assessment and support, frequent evaluation and titration of therapies, application of advanced monitoring technologies and extensive interpretation of multiple databases. Critical Care Time devoted to patient care services described in this note is 35 minutes.   Lonia Farber, MD Pulmonary and Critical Care Medicine [facility] Pager: [phone]  11/21/2013, 8:09 AM

## 2013-11-21 NOTE — Progress Notes (Signed)
Stroke Team Progress Note  HISTORY Todd Vargas is an 68 y.o. male --much of history obtained from chart--patient was LSN at 8 pm last night 11/17/2013. HE was found by a friend this morning 11/18/2013 with right eye gaze deviation, left arm hemiplegia and incontinent of stool and urine. CT head obtained on arrival shows a dense right MCA sign and evolving right MCA infarct. Currently patient is in ED, he is able to follow simple commands with his right side, able to say minimal words such as two, five yes but very dysarthric. Patient was not administerd TPA secondary to delay in arrival. Due to risk neurological worsening and impending brain herniation, he was admitted to the neuro ICU for further evaluation and treatment.  SUBJECTIVE No family members present this morning. The patient was very lethargic although he was able to follow simple commands. Dr. Pearlean Brownie updated the patient's family by telephone.  OBJECTIVE Most recent Vital Signs: Filed Vitals:   11/21/13 0816 11/21/13 0900 11/21/13 1000 11/21/13 1100  BP:  131/61 136/69 147/77  Pulse:  72 98 74  Temp: 98.6 F (37 C)     TempSrc: Oral     Resp:  21 26 21   Height:      Weight:      SpO2:  96% 95% 96%   CBG (last 3)   Recent Labs  11/21/13 0411 11/21/13 0725 11/21/13 1123  GLUCAP 124* 158* 134*    IV Fluid Intake:   . diltiazem (CARDIZEM) infusion 15 mg/hr (11/21/13 1004)    MEDICATIONS  . antiseptic oral rinse  15 mL Mouth Rinse BID  . aspirin  300 mg Rectal Daily  . carvedilol  3.125 mg Oral BID WC  . enoxaparin (LOVENOX) injection  40 mg Subcutaneous Daily  . insulin aspart  0-15 Units Subcutaneous Q4H  . irbesartan  75 mg Oral Daily  . lidocaine   Urethral Once   PRN:  ondansetron (ZOFRAN) IV  Diet:  NPO  Activity:  Bedrest DVT Prophylaxis:  Lovenox  CLINICALLY SIGNIFICANT STUDIES Basic Metabolic Panel:  Recent Labs Lab 11/20/13 0430  11/21/13 0315 11/21/13 0920  NA 154*  < > 152* 156*  K 4.0  --  3.3*   --   CL 116*  --  113*  --   CO2 24  --  26  --   GLUCOSE 161*  --  153*  --   BUN 10  --  14  --   CREATININE 0.66  --  0.70  --   CALCIUM 8.8  --  9.2  --   < > = values in this interval not displayed. Liver Function Tests:   Recent Labs Lab 11/18/13 1139  AST 20  ALT 17  ALKPHOS 136*  BILITOT 0.4  PROT 7.7  ALBUMIN 4.0   CBC:  Recent Labs Lab 11/18/13 1139  11/20/13 0430 11/21/13 0315  WBC 7.3  < > 9.3 10.2  NEUTROABS 5.5  --   --   --   HGB 13.3  < > 13.3 13.7  HCT 40.6  < > 41.4 43.2  MCV 77.9*  < > 80.4 81.4  PLT 154  < > 158 156  < > = values in this interval not displayed. Coagulation:   Recent Labs Lab 11/18/13 1139  LABPROT 13.1  INR 1.01   Cardiac Enzymes:   Recent Labs Lab 11/18/13 1139  CKTOTAL 127  TROPONINI <0.30   Urinalysis:   Recent Labs Lab 11/19/13 0208  COLORURINE  YELLOW  LABSPEC 1.026  PHURINE 6.0  GLUCOSEU NEGATIVE  HGBUR MODERATE*  BILIRUBINUR NEGATIVE  KETONESUR NEGATIVE  PROTEINUR 30*  UROBILINOGEN 0.2  NITRITE NEGATIVE  LEUKOCYTESUR SMALL*   Lipid Panel    Component Value Date/Time   CHOL 148 11/19/2013 0316   TRIG 104 11/19/2013 0316   HDL 39* 11/19/2013 0316   CHOLHDL 3.8 11/19/2013 0316   VLDL 21 11/19/2013 0316   LDLCALC 88 11/19/2013 0316   HgbA1C  Lab Results  Component Value Date   HGBA1C 6.9* 11/19/2013    Urine Drug Screen:   No results found for this basename: labopia,  cocainscrnur,  labbenz,  amphetmu,  thcu,  labbarb    Alcohol Level:   Recent Labs Lab 11/18/13 1144  ETH <11    CT of the brain   11/20/2013 . Continued interval evolution of large right MCA territory ischemic infarct with increased right-to-left subfalcine herniation, now measuring 1 cm, previously 5 mm on 11/18/2013. Asymmetric enlargement of the left lateral ventricle is slightly increased, worrisome for possible developing entrapment. 2. No evidence of hemorrhagic conversion.  11/18/2013   1. Large right MCA territory  non-hemorrhagic acute-subacute infarct. There is a hyperdense right MCA concerning for thrombus.   CT Angio Head 11/18/2013  Poor visualization of the right carotid terminus concerning for embolus propagating into the proximal right M1 and right A1 segments, however there is reconstitution distally suggesting nonocclusive embolus or early re- cannulization. Mildly attenuated right M2 and M3 appearance which may be in part due to compressive changes from right cerebral edema and evolving right MCA territory infarct. Worsening right cerebral edema, increasing right to left subfalcine herniation.  Complete circle of Willis.    CT Angio Neck  11/18/2013    No hemodynamically significant stenosis.  Suspected mediastinal lymphadenopathy, partially imaged.    MRI of the brain    MRA of the brain  See angio  2D Echocardiogram  EF 35-40% with no source of embolus. Diffuse hypokinesis  Carotid Doppler  Preliminary findings: Left = 1-39% ICA stenosis. Antegrade vertebral flow. Right = very limited views due to patient immobility to turn head. Cannot definitely determine grade of stenosis. No obvious significant stenosis noted.  CXR   11/19/2013 Persistent pulmonary infiltrates question pulmonary edema, little changed. 11/18/2013    Central line tip at the brachiocephalic venous confluence. No pneumothorax is identified.   11/18/2013  The findings suggest congestive heart failure with pulmonary interstitial and early alveolar edema.     EKG  normal sinus rhythm. For complete results please see formal report.   Therapy Recommendations SNF  Physical Exam   Obese middle aged african [place] male  . Afebrile. Head is nontraumatic. Neck is supple without bruit.  l. Cardiac exam no murmur or gallop. Lungs are clear to auscultation. Distal pulses are well felt. Neurological Exam : Eyes closed. Pupils of 4 mm sluggishly reactive. Right gaze preference but eyes can be moved reflexively to the midline. Does not blink  to threat left more than right. Left lower facial weakness. Tongue midline. Patient does not speak but will follow commands on the right side consistently. Flaccid left hemiplegia with no withdrawal to painful stimuli. Purposeful antigravity movements on the right side with good tone and strength. Left plantar upgoing right is downgoing. Decreased sensation on the left. Some left-sided neglect. Gait not tested.  ASSESSMENT [name] is a 68 y.o. male presenting with right eye deviation, left arm hemiplegia and incontinence of stool and urine. Imaging  confirms a right MCA infarct with cytotoxic cerebral edema, increasing overnight from 5mm ->10 mm midline shift and right to left subfalcine herniation, concern for potential ventricle entrapment. On hypertonic saline in order to decrease cerebral edema. Infarct felt to be embolic secondary to terminal ICA occlusion from new onset atrial fibrillation.  On no antithrombotics prior to admission. Now on aspirin 300 mg suppository daily for secondary stroke prevention. Patient with resultant lethargy, left hemiparesis, mutism, dysphagia, able to follow 1-step commands. Work up underway.    Dr. Danielle DessElsner, Neuro Surgery, does not feel decompressive craniotomy is in his best interest  Induced hypernatremia with 3% saline in order to decrease cerebral edema, Na 156   Atrial Fibrillation - new onset yesterday, not an oral anticoagulation candidate at this time due to NPO status. P[laced on cardizem for rate control. CCM added Add Coreg 3.125 / Avapro 75.   CHA2DS2-VASc Score for Atrial Fibrillation Stroke Risk = 5  (?2 oral anticoagulation recommended);  Yearly Stroke risk:  6.7%   Age in Years:  465-74   +1    Sex:  Male   0    Congestive Heart Failure History:  No  0    Hypertension History:  yes   +1    Stroke/TIA/Thromboembolism History:  yes   +2   Vascular Disease History:  No  0    Diabetes Mellitus:  yes   +1 hypertension Hyperlipidemia, LDL 88, on  no statin PTA, now on no statin as NPO, goal LDL < 70 for diabetics Diabetes, HgbA1c 6.9, goal < 7.0 Obesity, Body mass index is 36.26 kg/(m^2). Hypokalemia, replaced NG tube ordered Ejection fraction 3540% by echo.   Family has opted for no intubation - limited DNR   Hospital day # 3  TREATMENT/PLAN  Continue ICU level care  Continue  aspirin 300 mg suppository for secondary stroke prevention.  Na goal 150-155. Will continue protocol and follow Na q 6h. Turn back on if level drops less than 150.  Agree with plans for tube feeding. NG tube to be placed.  Lovenox additives DVT prophylaxis  Delton SeeDavid Rinehuls PA-C [facility] Pager [phone] 11/21/2013, 2:09 PM This patient is critically ill and at significant risk of neurological worsening, death and care requires constant monitoring of vital signs, hemodynamics,respiratory and cardiac monitoring,review of multiple databases, neurological assessment, discussion with family, other specialists and medical decision making of high complexity. I spent 30 minutes of neurocritical care time  in the care of  this patient. I have personally examined this patient, and developed plan of care, review of pertinent data and agree with the above Delia HeadyPramod Sethi, MD

## 2013-11-22 LAB — SODIUM
SODIUM: 159 meq/L — AB (ref 137–147)
Sodium: 157 mEq/L — ABNORMAL HIGH (ref 137–147)
Sodium: 155 mEq/L — ABNORMAL HIGH (ref 137–147)

## 2013-11-22 LAB — GLUCOSE, CAPILLARY
Glucose-Capillary: 182 mg/dL — ABNORMAL HIGH (ref 70–99)
Glucose-Capillary: 178 mg/dL — ABNORMAL HIGH (ref 70–99)
Glucose-Capillary: 169 mg/dL — ABNORMAL HIGH (ref 70–99)
Glucose-Capillary: 135 mg/dL — ABNORMAL HIGH (ref 70–99)

## 2013-11-22 LAB — BASIC METABOLIC PANEL
BUN: 25 mg/dL — AB (ref 6–23)
CALCIUM: 9.2 mg/dL (ref 8.4–10.5)
CHLORIDE: 117 meq/L — AB (ref 96–112)
CO2: 25 mEq/L (ref 19–32)
CREATININE: 0.86 mg/dL (ref 0.50–1.35)
GFR calc non Af Amer: 87 mL/min — ABNORMAL LOW (ref >90)
Glucose, Bld: 154 mg/dL — ABNORMAL HIGH (ref 70–99)
Potassium: 3.8 mEq/L (ref 3.7–5.3)
Sodium: 158 mEq/L — ABNORMAL HIGH (ref 137–147)

## 2013-11-22 LAB — MAGNESIUM: Magnesium: 2.5 mg/dL (ref 1.5–2.5)

## 2013-11-22 MED ORDER — DILTIAZEM 12 MG/ML ORAL SUSPENSION
60.0000 mg | Freq: Four times a day (QID) | ORAL | Status: DC
Start: 2013-11-22 — End: 2013-11-25
  Administered 2013-11-22 – 2013-11-25 (×12): 60 mg via ORAL
  Filled 2013-11-22 (×18): qty 6

## 2013-11-22 MED ORDER — POTASSIUM CHLORIDE 20 MEQ/15ML (10%) PO LIQD
20.0000 meq | Freq: Once | ORAL | Status: AC
  Administered 2013-11-22: 20 meq
  Filled 2013-11-22: qty 15

## 2013-11-22 MED ORDER — ASPIRIN 325 MG PO TABS
325.0000 mg | ORAL_TABLET | Freq: Every day | ORAL | Status: DC
  Administered 2013-11-22 – 2013-11-29 (×7): 325 mg via ORAL
  Filled 2013-11-22 (×8): qty 1

## 2013-11-22 MED ORDER — BIOTENE DRY MOUTH MT LIQD
15.0000 mL | Freq: Two times a day (BID) | OROMUCOSAL | Status: DC
  Administered 2013-11-22 – 2013-11-30 (×17): 15 mL via OROMUCOSAL

## 2013-11-22 MED ORDER — CHLORHEXIDINE GLUCONATE 0.12 % MT SOLN
15.0000 mL | Freq: Two times a day (BID) | OROMUCOSAL | Status: DC
  Administered 2013-11-22 – 2013-11-30 (×17): 15 mL via OROMUCOSAL
  Filled 2013-11-22 (×19): qty 15

## 2013-11-22 NOTE — Progress Notes (Signed)
Pt had a 10beat run of Vtach. Vitals WNL. Neuro status at baseline per beginning of shift.  MD made aware, orders received. Will Continue to monitor closely.

## 2013-11-22 NOTE — Progress Notes (Signed)
Stroke Team Progress Note  HISTORY Todd Vargas is an 68 y.o. male --much of history obtained from chart--patient was LSN at 8 pm last night 11/17/2013. HE was found by a friend this morning 11/18/2013 with right eye gaze deviation, left arm hemiplegia and incontinent of stool and urine. CT head obtained on arrival shows a dense right MCA sign and evolving right MCA infarct. Currently patient is in ED, he is able to follow simple commands with his right side, able to say minimal words such as two, five yes but very dysarthric. Patient was not administerd TPA secondary to delay in arrival. Due to risk neurological worsening and impending brain herniation, he was admitted to the neuro ICU for further evaluation and treatment.  SUBJECTIVE Remains stable lethargic follows commands yet.Sodium yet elevated and 3 % drip off  OBJECTIVE Most recent Vital Signs: Filed Vitals:   11/22/13 0600 11/22/13 0610 11/22/13 0700 11/22/13 0735  BP:  160/62 113/62   Pulse:  39 101   Temp:    99.6 F (37.6 C)  TempSrc:    Axillary  Resp:  25 17   Height:      Weight: 122.2 kg (269 lb 6.4 oz)     SpO2:  94% 95%    CBG (last 3)   Recent Labs  11/21/13 2042 11/21/13 2336 11/22/13 0344  GLUCAP 129* 158* 182*    IV Fluid Intake:   . diltiazem (CARDIZEM) infusion 15 mg/hr (11/22/13 0413)  . feeding supplement (OSMOLITE 1.5 CAL) 1,000 mL (11/22/13 0000)    MEDICATIONS  . antiseptic oral rinse  15 mL Mouth Rinse q12n4p  . aspirin  325 mg Oral Daily  . carvedilol  3.125 mg Oral BID WC  . chlorhexidine  15 mL Mouth Rinse BID  . enoxaparin (LOVENOX) injection  40 mg Subcutaneous Daily  . feeding supplement (PRO-STAT SUGAR FREE 64)  30 mL Per Tube 5 X Daily  . insulin aspart  0-15 Units Subcutaneous Q4H  . irbesartan  75 mg Oral Daily   PRN:  ondansetron (ZOFRAN) IV  Diet:  NPO  Activity:  Bedrest DVT Prophylaxis:  Lovenox  CLINICALLY SIGNIFICANT STUDIES Basic Metabolic Panel:  Recent Labs Lab  11/21/13 0315  11/21/13 2220 11/22/13 0100  NA 152*  < > 157* 158*  K 3.3*  --   --  3.8  CL 113*  --   --  117*  CO2 26  --   --  25  GLUCOSE 153*  --   --  154*  BUN 14  --   --  25*  CREATININE 0.70  --   --  0.86  CALCIUM 9.2  --   --  9.2  MG  --   --   --  2.5  < > = values in this interval not displayed. Liver Function Tests:   Recent Labs Lab 11/18/13 1139  AST 20  ALT 17  ALKPHOS 136*  BILITOT 0.4  PROT 7.7  ALBUMIN 4.0   CBC:  Recent Labs Lab 11/18/13 1139  11/20/13 0430 11/21/13 0315  WBC 7.3  < > 9.3 10.2  NEUTROABS 5.5  --   --   --   HGB 13.3  < > 13.3 13.7  HCT 40.6  < > 41.4 43.2  MCV 77.9*  < > 80.4 81.4  PLT 154  < > 158 156  < > = values in this interval not displayed. Coagulation:   Recent Labs Lab 11/18/13 1139  LABPROT 13.1  INR 1.01   Cardiac Enzymes:   Recent Labs Lab 11/18/13 1139  CKTOTAL 127  TROPONINI <0.30   Urinalysis:   Recent Labs Lab 11/19/13 0208  COLORURINE YELLOW  LABSPEC 1.026  PHURINE 6.0  GLUCOSEU NEGATIVE  HGBUR MODERATE*  BILIRUBINUR NEGATIVE  KETONESUR NEGATIVE  PROTEINUR 30*  UROBILINOGEN 0.2  NITRITE NEGATIVE  LEUKOCYTESUR SMALL*   Lipid Panel    Component Value Date/Time   CHOL 148 11/19/2013 0316   TRIG 104 11/19/2013 0316   HDL 39* 11/19/2013 0316   CHOLHDL 3.8 11/19/2013 0316   VLDL 21 11/19/2013 0316   LDLCALC 88 11/19/2013 0316   HgbA1C  Lab Results  Component Value Date   HGBA1C 6.9* 11/19/2013    Urine Drug Screen:   No results found for this basename: labopia,  cocainscrnur,  labbenz,  amphetmu,  thcu,  labbarb    Alcohol Level:   Recent Labs Lab 11/18/13 1144  ETH <11    CT of the brain   11/20/2013 . Continued interval evolution of large right MCA territory ischemic infarct with increased right-to-left subfalcine herniation, now measuring 1 cm, previously 5 mm on 11/18/2013. Asymmetric enlargement of the left lateral ventricle is slightly increased, worrisome for possible  developing entrapment. 2. No evidence of hemorrhagic conversion.  11/18/2013   1. 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 There is a hyperdense right MCA concerning for thrombus.   CT Angio Head 11/18/2013  Poor visualization of the right carotid terminus concerning for embolus propagating into the proximal right M1 and right A1 segments, however there is reconstitution distally suggesting nonocclusive embolus or early re- cannulization. Mildly attenuated right M2 and M3 appearance which may be in part due to compressive changes from right cerebral edema and evolving right MCA territory infarct. Worsening right cerebral edema, increasing right to left subfalcine herniation.  Complete circle of Willis.    CT Angio Neck  11/18/2013    No hemodynamically significant stenosis.  Suspected mediastinal lymphadenopathy, partially imaged.    MRI of the brain    MRA of the brain  See angio  2D Echocardiogram  EF 35-40% with no source of embolus. Diffuse hypokinesis  Carotid Doppler  Preliminary findings: Left = 1-39% ICA stenosis. Antegrade vertebral flow. Right = very limited views due to patient immobility to turn head. Cannot definitely determine grade of stenosis. No obvious significant stenosis noted.  CXR   11/19/2013 Persistent pulmonary infiltrates question pulmonary edema, little changed. 11/18/2013    Central line tip at the brachiocephalic venous confluence. No pneumothorax is identified.   11/18/2013  The findings suggest congestive heart failure with pulmonary interstitial and early alveolar edema.     EKG  normal sinus rhythm. For complete results please see formal report.   Therapy Recommendations SNF  Physical Exam   Obese middle aged african [place] male  . Afebrile. Head is nontraumatic. Neck is supple without bruit.  l. Cardiac exam no murmur or gallop. Lungs are clear to auscultation. Distal pulses are well felt. Neurological Exam : Eyes closed. Pupils of 4 mm  sluggishly reactive. Right gaze preference but eyes can be moved reflexively to the midline. Does not blink to threat left more than right. Left lower facial weakness. Tongue midline. Patient does not speak but will follow commands on the right side consistently. Flaccid left hemiplegia with no withdrawal to painful stimuli. Purposeful antigravity movements on the right side with good tone and strength. Left plantar upgoing right is downgoing. Decreased sensation on the left.  Some left-sided neglect. Gait not tested.  ASSESSMENT Todd Vargas is a 68 y.o. male presenting with right eye deviation, left arm hemiplegia and incontinence of stool and urine. Imaging confirms a right MCA infarct with cytotoxic cerebral edema, increasing overnight from 5mm ->10 mm midline shift and right to left subfalcine herniation, concern for potential ventricle entrapment. On hypertonic saline in order to decrease cerebral edema. Infarct felt to be embolic secondary to terminal ICA occlusion from new onset atrial fibrillation.  On no antithrombotics prior to admission. Now on aspirin 300 mg suppository daily for secondary stroke prevention. Patient with resultant lethargy, left hemiparesis, mutism, dysphagia, able to follow 1-step commands. Work up underway.    Dr. Danielle Dess, Neuro Surgery, does not feel decompressive craniotomy is in his best interest  Induced hypernatremia with 3% saline in order to decrease cerebral edema, Na 158   Atrial Fibrillation - new onset yesterday, not an oral anticoagulation candidate at this time due to NPO status. P[laced on cardizem for rate control. CCM added Add Coreg 3.125 / Avapro 75.   CHA2DS2-VASc Score for Atrial Fibrillation Stroke Risk = 5  (?2 oral anticoagulation recommended);  Yearly Stroke risk:  6.7%   Age in Years:  61-74   +1    Sex:  Male   0    Congestive Heart Failure History:  No  0    Hypertension History:  yes   +1    Stroke/TIA/Thromboembolism History:  yes    +2   Vascular Disease History:  No  0    Diabetes Mellitus:  yes   +1 hypertension Hyperlipidemia, LDL 88, on no statin PTA, now on no statin as NPO, goal LDL < 70 for diabetics Diabetes, HgbA1c 6.9, goal < 7.0 Obesity, Body mass index is 33.67 kg/(m^2). Hypokalemia, replaced NG tube ordered Ejection fraction 35-40% by echo.   Family has opted for no intubation - limited DNR   Hospital day # 4  TREATMENT/PLAN  Transfer to step down level care  Continue  aspirin 300 mg suppository for secondary stroke prevention.  Na goal 150-155. Will continue protocol and follow Na q 6h. Turn back on if level drops less than 150 and f/u CT tomorrow shows increasing edema and shift  D/W Dr Aloha Gell. Change cardizem drip to oral  I have personally obtained a history, examined the patient, evaluated imaging results, and formulated the assessment and plan of care. I agree with the above.  This patient is critically ill and at significant risk of neurological worsening, death and care requires constant monitoring of vital signs, hemodynamics,respiratory and cardiac monitoring,review of multiple databases, neurological assessment, discussion with family, other specialists and medical decision making of high complexity. I spent 30 minutes of neurocritical care time  in the care of  this patient.  Delia Heady, MD

## 2013-11-22 NOTE — Progress Notes (Signed)
UR completed.  Tsugio Elison, RN BSN MHA CCM Trauma/Neuro ICU Case Manager [phone]  

## 2013-11-22 NOTE — Progress Notes (Signed)
Physical Therapy Treatment Patient Details Name: Todd Vargas MRN: [id number] DOB: 01/09/1946 Today's Date: 11/22/2013 Time: [id number] PT Time Calculation (min): 14 min  PT Assessment / Plan / Recommendation  History of Present Illness 68 yo with 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   PT Comments   Pt continues to be non verbal and is inconsistent with following commands with Rt UE. Pt continues to demo Lt sided neglect and no trace of movement noted during session. Unable to safely assess transfers today. Resting HR ~120s and HR decreased sitting EOB to as low as 67; pt promptly returned to supine and RN notified.   Follow Up Recommendations  SNF;Supervision/Assistance - 24 hour     Does the patient have the potential to tolerate intense rehabilitation     Barriers to Discharge        Equipment Recommendations  None recommended by PT    Recommendations for Other Services    Frequency Min 3X/week   Progress towards PT Goals Progress towards PT goals: Not progressing toward goals - comment (continues to be limited due to cognition and medical state )  Plan Current plan remains appropriate    Precautions / Restrictions Precautions Precautions: Fall Restrictions Weight Bearing Restrictions: No   Pertinent Vitals/Pain See comments for vitals.    Mobility  Bed Mobility Overal bed mobility: +2 for physical assistance;Needs Assistance Bed Mobility: Sit to Supine;Supine to Sit;Rolling Rolling: Total assist Supine to sit: +2 for physical assistance;Total assist;HOB elevated Sit to supine: HOB elevated;+2 for physical assistance;+2 for safety/equipment;Total assist General bed mobility comments: pt did not (A) with bed mobility at all; max cues and total (A) for sitting EOB Transfers General transfer comment: pt not safe to transfer at this time  due to decr HR with sitting EOB Modified Rankin (Stroke Patients Only) Pre-Morbid Rankin Score: No symptoms Modified Rankin: Severe disability     Exercises Other Exercises Other Exercises: pt inconsistently able to perform ankle pumps and heel slides on Rt LE with multmodal cues   PT Diagnosis:    PT Problem List:   PT Treatment Interventions:     PT Goals (current goals can now be found in the care plan section) Acute Rehab PT Goals Patient Stated Goal: none stated PT Goal Formulation: Patient unable to participate in goal setting Time For Goal Achievement: 12/04/13 Potential to Achieve Goals: Fair  Visit Information  Last PT Received On: 11/22/13 Assistance Needed: +2 History of Present Illness: 68 yo with 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    Subjective Data  Subjective: Pt lying supine; able to give thumbs up on command in Rt UE (inconsistently)  Patient Stated Goal: none stated   Cognition  Cognition Arousal/Alertness: Awake/alert Behavior During Therapy: Flat affect Overall Cognitive Status: Impaired/Different from baseline Area of Impairment: Following commands;Problem solving;Attention Current Attention Level: Focused Following Commands: Follows one step commands with increased time;Follows one step commands inconsistently Problem Solving: Slow processing;Decreased initiation;Difficulty sequencing;Requires verbal cues;Requires tactile cues General Comments: pt was inconsistent with ability to follow commands; pt neglecting Lt side of body    Balance  Balance Overall balance assessment: History of Falls;Needs assistance Sitting-balance support: Feet supported;Single extremity supported Sitting balance-Leahy Scale: Zero Sitting balance - Comments: pt with no ability to hold himself up at EOB; leaning posteriorly throughout sitting EOB; HR greatly decr and was returned to supine; tolerated ~3 min EOB Postural control: Posterior lean General Comments General comments (skin integrity, edema, etc.): tongue protruding from mouth  End of  Session PT - End of Session Equipment Utilized During Treatment:  Oxygen Activity Tolerance: Patient limited by fatigue;Treatment limited secondary to medical complications (Comment) Patient left: in bed;with call bell/phone within reach;with restraints reapplied;Other (comment) (Rt mitten ) Nurse Communication: Mobility status;Other (comment) (HR)   GP     Shelva Majestic [place], [place] [phone] 11/22/2013, 3:48 PM

## 2013-11-22 NOTE — Progress Notes (Addendum)
PULMONARY / CRITICAL CARE MEDICINE  Name: Todd Vargas MRN: [id number] DOB: 01/01/1946    ADMISSION DATE:  11/18/2013 CONSULTATION DATE:  11/18/2013  REFERRING MD :  Neurology PRIMARY SERVICE: Neurology  CHIEF COMPLAINT:  Stroke   BRIEF PATIENT DESCRIPTION: 68 yo admitted on 1/26 w/ right MCA CVA. Deficits included: severe dysarthria and left sided neglect. PCCM asked to assess for CVL placement in effort to provide hypertonic saline and out of concern for inability to protect airway / impending respiratory failure.  SIGNIFICANT EVENTS / STUDIES:  1/26  Head CT >>> Evolving right MCA CVA  1/27  Carotid doppler >>> The left vertebral artery appears patent with antegrade flow. Findings consistent with 1-39 percent stenosis involving the left internal carotid artery. Very limited views of the right carotid system due to patient inability to turn head. Cannot determine grade of stenosis. 1/27  Code status changed to partial >>> No intubation / No CPR 1/28  Head CT >>> Increased right-to-left subfalcine herniation, now measuring 1 cm, previously 5 mm  1/28  TTE >>> EF 30, diffuse hypokinesis, L/R atria severely dilated  LINES / TUBES: L IJ CVL 1/26 >>> NGT 1/29 >>>  CULTURES:  ANTIBIOTICS:  SUBJECTIVE: Short self limited run of VT.  Feeding tube placed.  VITAL SIGNS: Temp:  [98.6 F (37 C)-99.6 F (37.6 C)] 99.6 F (37.6 C) (01/30 0735) Pulse Rate:  [39-114] 92 (01/30 0800) Resp:  [11-34] 24 (01/30 0800) BP: (113-168)/(61-99) 124/76 mmHg (01/30 0800) SpO2:  [93 %-98 %] 95 % (01/30 0800) Weight:  [122.2 kg (269 lb 6.4 oz)] 122.2 kg (269 lb 6.4 oz) (01/30 0600)  HEMODYNAMICS:   VENTILATOR SETTINGS:    INTAKE / OUTPUT: Intake/Output     01/29 0701 - 01/30 0700 01/30 0701 - 01/31 0700   I.V. (mL/kg) 272 (2.2) 10 (0.1)   NG/GT 540 50   IV Piggyback     Total Intake(mL/kg) 812 (6.6) 60 (0.5)   Urine (mL/kg/hr) 1625 (0.6)    Total Output 1625     Net -813 +60           PHYSICAL EXAMINATION: General:  No distress Neuro:  L hemiparesis HEENT:  Moist membranes Cardiovascular:  Irregularly irregular Lungs:  Scattered rhonchi Abdomen:  Soft, non-tender  Musculoskeletal:  Trace edema Skin:  Intact   LABS:  CBC  Recent Labs Lab 11/18/13 1500 11/20/13 0430 11/21/13 0315  WBC 8.7 9.3 10.2  HGB 12.7* 13.3 13.7  HCT 38.8* 41.4 43.2  PLT 179 158 156   Coag's  Recent Labs Lab 11/18/13 1139 11/18/13 1144  APTT  --  31  INR 1.01  --    BMET  Recent Labs Lab 11/20/13 0430  11/21/13 0315  11/21/13 1500 11/21/13 2220 11/22/13 0100  NA 154*  < > 152*  < > 155* 157* 158*  K 4.0  --  3.3*  --   --   --  3.8  CL 116*  --  113*  --   --   --  117*  CO2 24  --  26  --   --   --  25  BUN 10  --  14  --   --   --  25*  CREATININE 0.66  --  0.70  --   --   --  0.86  GLUCOSE 161*  --  153*  --   --   --  154*  < > = values in this interval not displayed. Electrolytes  Recent Labs Lab 11/20/13 0430 11/21/13 0315 11/22/13 0100  CALCIUM 8.8 9.2 9.2  MG  --   --  2.5   Sepsis Markers  Recent Labs Lab 11/18/13 1139  LATICACIDVEN 3.2*   ABG No results found for this basename: PHART, PCO2ART, PO2ART,  in the last 168 hours  Liver Enzymes  Recent Labs Lab 11/18/13 1139  AST 20  ALT 17  ALKPHOS 136*  BILITOT 0.4  ALBUMIN 4.0   Cardiac Enzymes  Recent Labs Lab 11/18/13 1139  TROPONINI <0.30   Glucose  Recent Labs Lab 11/21/13 0725 11/21/13 1123 11/21/13 1624 11/21/13 2042 11/21/13 2336 11/22/13 0344  GLUCAP 158* 134* 148* 129* 158* 182*   CXR: None today  ASSESSMENT / PLAN:  PULMONARY A:  Impending respiratory failure / at risk for needing airway / mechanical ventilation in setting of worsening cerebral edema. P:   Supplemental oxygen for goal SpO2>92 BiPAP / NIMV contraindicated DNI  CARDIOVASCULAR A:  HTN AF/RVR, now controlled rate Acute ( on chronic ? ) congestive heart failure P:  BP goal per  Neurology ASA Coreg 3.125 / Avapro 75 Start Cardizem 60 q6h D/c Cardizem gtt  DNR  RENAL A:  Hypernatremia, induced Hypokalemia P:   Trend BMP Hold hypertonic saline, restart for Na < 150 K 20 x 1  GASTROINTESTINAL A:   Dysphagia GI Px is not indicated P:   NPO  TF per Nutritionist  HEMATOLOGIC A:   Anemia No overt hemorrhage P:  Trend CBC Lovenox  INFECTIOUS A:   No acute issues P:   Monitor off abx  ENDOCRINE A:   DM II P:   SSI  NEUROLOGIC A:   Right MCA CVA  Acute encephalopathy P:   Neurology following Hypertonic saline as above Consider downgrading  SDU status.  PCCM will sign off.  Please reconsult as necessary.  I have personally obtained history, examined patient, evaluated and interpreted laboratory and imaging results, reviewed medical records, formulated assessment / plan and placed orders.  Lonia Farber, MD Pulmonary and Critical Care Medicine [facility] Pager: [phone]  11/22/2013, 8:21 AM

## 2013-11-22 NOTE — Progress Notes (Signed)
2035: Report called to 3S RN. All questions and concerns addressed. 2114: Katha Cabal, Niece, called updated on pt's transfer to room 3S15. No questions or concerns. Encouraged Marchelle Folks to update rest of family on pt's location. All belongings packed and transported with pt.

## 2013-11-22 NOTE — Progress Notes (Signed)
SLP Cancellation Note  Patient Details Name: Todd Vargas MRN: [id number] DOB: 17-Feb-1946   Cancelled treatment:        Observed pt.'s interaction with neurologist and spoke with RN.  Pt. not appropriate to attempt po's at present due to continued  lethargy and poor awareness.   Breck Coons Galliano.Ed [employer] [phone]  11/22/2013  .

## 2013-11-23 ENCOUNTER — Inpatient Hospital Stay (HOSPITAL_COMMUNITY): Payer: Medicare Other

## 2013-11-23 LAB — BASIC METABOLIC PANEL
BUN: 35 mg/dL — AB (ref 6–23)
CO2: 25 mEq/L (ref 19–32)
Calcium: 9.3 mg/dL (ref 8.4–10.5)
Chloride: 117 mEq/L — ABNORMAL HIGH (ref 96–112)
Creatinine, Ser: 0.89 mg/dL (ref 0.50–1.35)
GFR calc Af Amer: >90 mL/min (ref >90)
GFR calc non Af Amer: 86 mL/min — ABNORMAL LOW (ref >90)
GLUCOSE: 211 mg/dL — AB (ref 70–99)
Potassium: 3.7 mEq/L (ref 3.7–5.3)
Sodium: 156 mEq/L — ABNORMAL HIGH (ref 137–147)

## 2013-11-23 LAB — GLUCOSE, CAPILLARY
GLUCOSE-CAPILLARY: 158 mg/dL — AB (ref 70–99)
GLUCOSE-CAPILLARY: 192 mg/dL — AB (ref 70–99)
Glucose-Capillary: 148 mg/dL — ABNORMAL HIGH (ref 70–99)
Glucose-Capillary: 125 mg/dL — ABNORMAL HIGH (ref 70–99)
Glucose-Capillary: 154 mg/dL — ABNORMAL HIGH (ref 70–99)
Glucose-Capillary: 136 mg/dL — ABNORMAL HIGH (ref 70–99)
Glucose-Capillary: 187 mg/dL — ABNORMAL HIGH (ref 70–99)
Glucose-Capillary: 149 mg/dL — ABNORMAL HIGH (ref 70–99)

## 2013-11-23 LAB — CBC
HEMATOCRIT: 45.3 % (ref 39.0–52.0)
HEMOGLOBIN: 14.3 g/dL (ref 13.0–17.0)
MCH: 25.8 pg — ABNORMAL LOW (ref 26.0–34.0)
MCHC: 31.6 g/dL (ref 30.0–36.0)
MCV: 81.6 fL (ref 78.0–100.0)
Platelets: 134 10*3/uL — ABNORMAL LOW (ref 150–400)
RBC: 5.55 MIL/uL (ref 4.22–5.81)
RDW: 16 % — ABNORMAL HIGH (ref 11.5–15.5)
WBC: 10.7 10*3/uL — ABNORMAL HIGH (ref 4.0–10.5)

## 2013-11-23 LAB — SODIUM
Sodium: 157 mEq/L — ABNORMAL HIGH (ref 137–147)
Sodium: 161 mEq/L — ABNORMAL HIGH (ref 137–147)

## 2013-11-23 NOTE — Progress Notes (Signed)
Clinical Social Work Department BRIEF PSYCHOSOCIAL ASSESSMENT 11/23/2013  Patient:  Todd Vargas, Todd Vargas     Account Number:  [id number]     Admit date:  11/18/2013  Clinical Social Worker:  Su Monks  Date/Time:  11/23/2013 05:02 PM  Referred by:  Physician  Date Referred:  11/22/2013 Referred for  SNF Placement   Other Referral:   Interview type:  Family Other interview type:   Weekend CSW met with patient's sister Todd Vargas [phone] & neice Todd Vargas [phone], who were present at the bedside.    PSYCHOSOCIAL DATA Living Status:  FAMILY Admitted from facility:   Level of care:   Primary support name:  Todd Vargas [phone] Primary support relationship to patient:  SIBLING Degree of support available:   Strong    CURRENT CONCERNS Current Concerns  Post-Acute Placement   Other Concerns:    SOCIAL WORK ASSESSMENT / PLAN Weekend CSW met with patient, his sister Todd Vargas, and his neice Todd Vargas. Patient was not communicative and did not engage in assessment. CSW introduced herself and explained role, family agreeable to speak. According to family, patient moved to [place] from [place] approximately 1 year ago. He has recently been staying with his sister Todd Vargas. Family states that his prior functioning was good- that he walked with a walker but was able to get around well and drive. Neice Todd Vargas lives in [place]. CSW explained PT recommendations of SNF, and asked for family's thoughts. Sister and neice appeared hesitant but stated "whatever is going to help him get better." CSW explained the benefits of SNF rehab for patients. CSW also assured family that they did not have to decide on anything at this time but that initiating the facility search would be beneficial to patient's after care planning. Family verbalized their understanding and provided CSW with permission to initiate search in [place].   Assessment/plan status:  Information/Referral to [employer] Other assessment/  plan:   Information/referral to community resources:   CSW provided family with [place] SNF listing    PATIENT'S/FAMILY'S RESPONSE TO PLAN OF CARE: Sister and neice appeared hesitant when asked about SNF placement but stated "whatever is going to help him get better." Family agreeable to SNF search in [place]. and thanked CSW for assistance.    Tilden Fossa, MSW, [place] Clinical Social Worker [facility] Emergency Dept. [phone]

## 2013-11-23 NOTE — Progress Notes (Signed)
Stroke Team Progress Note  HISTORY Todd Vargas is an 68 y.o. male --much of history obtained from chart--patient was LSN at 8 pm last night 11/17/2013. HE was found by a friend this morning 11/18/2013 with right eye gaze deviation, left arm hemiplegia and incontinent of stool and urine. CT head obtained on arrival shows a dense right MCA sign and evolving right MCA infarct. Currently patient is in ED, he is able to follow simple commands with his right side, able to say minimal words such as two, five yes but very dysarthric. Patient was not administerd TPA secondary to delay in arrival. Due to risk neurological worsening and impending brain herniation, he was admitted to the neuro ICU for further evaluation and treatment.  SUBJECTIVE Remains stable lethargic follows commands yet.Sodium yet elevated and 3 % drip off  OBJECTIVE Most recent Vital Signs: Filed Vitals:   11/22/13 2337 11/23/13 0403 11/23/13 0525 11/23/13 0757  BP: 157/96 125/78    Pulse: 72 57  110  Temp: 97.9 F (36.6 C) 99.5 F (37.5 C)  99.1 F (37.3 C)  TempSrc: Oral Axillary  Oral  Resp: 9 25    Height:      Weight:   123.7 kg (272 lb 11.3 oz)   SpO2: 95% 96%     CBG (last 3)   Recent Labs  11/22/13 2339 11/23/13 0404 11/23/13 0754  GLUCAP 158* 192* 125*    IV Fluid Intake:   . feeding supplement (OSMOLITE 1.5 CAL) 1,000 mL (11/22/13 1852)    MEDICATIONS  . antiseptic oral rinse  15 mL Mouth Rinse q12n4p  . aspirin  325 mg Oral Daily  . carvedilol  3.125 mg Oral BID WC  . chlorhexidine  15 mL Mouth Rinse BID  . diltiazem  60 mg Oral Q6H  . enoxaparin (LOVENOX) injection  40 mg Subcutaneous Daily  . feeding supplement (PRO-STAT SUGAR FREE 64)  30 mL Per Tube 5 X Daily  . insulin aspart  0-15 Units Subcutaneous Q4H  . irbesartan  75 mg Oral Daily   PRN:  ondansetron (ZOFRAN) IV  Diet:  NPO  Activity:  Bedrest DVT Prophylaxis:  Lovenox  CLINICALLY SIGNIFICANT STUDIES Basic Metabolic Panel:  Recent  Labs Lab 11/21/13 2220 11/22/13 0100  11/22/13 2120 11/23/13 0320  NA 157* 158*  < > 159* 156*  K  --  3.8  --   --  3.7  CL  --  117*  --   --  117*  CO2  --  25  --   --  25  GLUCOSE  --  154*  --   --  211*  BUN  --  25*  --   --  35*  CREATININE  --  0.86  --   --  0.89  CALCIUM  --  9.2  --   --  9.3  MG  --  2.5  --   --   --   < > = values in this interval not displayed. Liver Function Tests:   Recent Labs Lab 11/18/13 1139  AST 20  ALT 17  ALKPHOS 136*  BILITOT 0.4  PROT 7.7  ALBUMIN 4.0   CBC:  Recent Labs Lab 11/18/13 1139  11/21/13 0315 11/23/13 0320  WBC 7.3  < > 10.2 10.7*  NEUTROABS 5.5  --   --   --   HGB 13.3  < > 13.7 14.3  HCT 40.6  < > 43.2 45.3  MCV 77.9*  < >  81.4 81.6  PLT 154  < > 156 134*  < > = values in this interval not displayed. Coagulation:   Recent Labs Lab 11/18/13 1139  LABPROT 13.1  INR 1.01   Cardiac Enzymes:   Recent Labs Lab 11/18/13 1139  CKTOTAL 127  TROPONINI <0.30   Urinalysis:   Recent Labs Lab 11/19/13 0208  COLORURINE YELLOW  LABSPEC 1.026  PHURINE 6.0  GLUCOSEU NEGATIVE  HGBUR MODERATE*  BILIRUBINUR NEGATIVE  KETONESUR NEGATIVE  PROTEINUR 30*  UROBILINOGEN 0.2  NITRITE NEGATIVE  LEUKOCYTESUR SMALL*   Lipid Panel    Component Value Date/Time   CHOL 148 11/19/2013 0316   TRIG 104 11/19/2013 0316   HDL 39* 11/19/2013 0316   CHOLHDL 3.8 11/19/2013 0316   VLDL 21 11/19/2013 0316   LDLCALC 88 11/19/2013 0316   HgbA1C  Lab Results  Component Value Date   HGBA1C 6.9* 11/19/2013    Urine Drug Screen:   No results found for this basename: labopia,  cocainscrnur,  labbenz,  amphetmu,  thcu,  labbarb    Alcohol Level:   Recent Labs Lab 11/18/13 1144  ETH <11    CT of the brain   11/20/2013 . Continued interval evolution of large right MCA territory ischemic infarct with increased right-to-left subfalcine herniation, now measuring 1 cm, previously 5 mm on 11/18/2013. Asymmetric enlargement of  the left lateral ventricle is slightly increased, worrisome for possible developing entrapment. 2. No evidence of hemorrhagic conversion.  11/18/2013   1. 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 There is a hyperdense right MCA concerning for thrombus.   11/23/2013- right middle cerebral artery territory infarct with overall  decreased mass effect, no hemorrhagic conversion   CT Angio Head 11/18/2013  Poor visualization of the right carotid terminus concerning for embolus propagating into the proximal right M1 and right A1 segments, however there is reconstitution distally suggesting nonocclusive embolus or early re- cannulization. Mildly attenuated right M2 and M3 appearance which may be in part due to compressive changes from right cerebral edema and evolving right MCA territory infarct. Worsening right cerebral edema, increasing right to left subfalcine herniation.  Complete circle of Willis.    CT Angio Neck  11/18/2013    No hemodynamically significant stenosis.  Suspected mediastinal lymphadenopathy, partially imaged.    MRI of the brain    MRA of the brain  See angio  2D Echocardiogram  EF 35-40% with no source of embolus. Diffuse hypokinesis  Carotid Doppler  Preliminary findings: Left = 1-39% ICA stenosis. Antegrade vertebral flow. Right = very limited views due to patient immobility to turn head. Cannot definitely determine grade of stenosis. No obvious significant stenosis noted.  CXR   11/19/2013 Persistent pulmonary infiltrates question pulmonary edema, little changed. 11/18/2013    Central line tip at the brachiocephalic venous confluence. No pneumothorax is identified.   11/18/2013  The findings suggest congestive heart failure with pulmonary interstitial and early alveolar edema.     EKG  normal sinus rhythm. For complete results please see formal report.   Therapy Recommendations SNF  Physical Exam   Obese middle aged african [place] male  . Afebrile.  Head is nontraumatic. Neck is supple without bruit.  l. Cardiac exam no murmur or gallop. Lungs are clear to auscultation. Distal pulses are well felt. Neurological Exam : Eyes closed. Pupils of 4 mm sluggishly reactive. Right gaze preference but eyes can be moved reflexively to the midline. Does not blink to threat left more than right. Left  lower facial weakness. Tongue midline. Patient does not speak but will follow commands on the right side consistently. Flaccid left hemiplegia with no withdrawal to painful stimuli. Purposeful antigravity movements on the right side with good tone and strength. Left plantar upgoing right is downgoing. Decreased sensation on the left. Some left-sided neglect. Gait not tested.  ASSESSMENT Todd Vargas is a 68 y.o. male presenting with right eye deviation, left arm hemiplegia and incontinence of stool and urine. Imaging confirms a right MCA infarct with cytotoxic cerebral edema, increasing overnight from 5mm ->10 mm midline shift and right to left subfalcine herniation, concern for potential ventricle entrapment. On hypertonic saline in order to decrease cerebral edema. Infarct felt to be embolic secondary to terminal ICA occlusion from new onset atrial fibrillation.  On no antithrombotics prior to admission. Now on aspirin 300 mg suppository daily for secondary stroke prevention. Patient with resultant lethargy, left hemiparesis, mutism, dysphagia, able to follow 1-step commands. Work up underway.    Dr. Danielle DessElsner, Neuro Surgery, does not feel decompressive craniotomy is in his best interest  Induced hypernatremia with 3% saline in order to decrease cerebral edema, Na 158   Atrial Fibrillation - new onset yesterday, not an oral anticoagulation candidate at this time due to NPO status. P[laced on cardizem for rate control. CCM added Add Coreg 3.125 / Avapro 75.   CHA2DS2-VASc Score for Atrial Fibrillation Stroke Risk = 5  (?2 oral anticoagulation recommended);  Yearly  Stroke risk:  6.7%   Age in Years:  6865-74   +1    Sex:  Male   0    Congestive Heart Failure History:  No  0    Hypertension History:  yes   +1    Stroke/TIA/Thromboembolism History:  yes   +2   Vascular Disease History:  No  0    Diabetes Mellitus:  yes   +1 hypertension Hyperlipidemia, LDL 88, on no statin PTA, now on no statin as NPO, goal LDL < 70 for diabetics Diabetes, HgbA1c 6.9, goal < 7.0 Obesity, Body mass index is 34.09 kg/(m^2). Hypokalemia, replaced NG tube ordered Ejection fraction 35-40% by echo.   Family has opted for no intubation - limited DNR   Hospital day # 5  TREATMENT/PLAN  Continue  aspirin 300 mg suppository for secondary stroke prevention.  Na goal 150-155. Will continue protocol and follow Na q 6h. Currently at 156.   Repeat CTH with evolving R MCA infarct but decrease overall mass affect.   Hold 3% until NA <150  On oral Cardizem 60 q6 rate 90s-120s  No family at bedside   I have personally obtained a history, examined the patient, evaluated imaging results, and formulated the assessment and plan of care. I agree with the above. Pauletta BrownsZEYLIKMAN, Brooklynne Pereida

## 2013-11-23 NOTE — Progress Notes (Signed)
Pt pulled NG tube partially out, tube was easily reinserted. Pt tolerated procedure well. KUB obtained to verify placement, tube feedings on hold until placement verified. Will continue to monitor.

## 2013-11-24 LAB — GLUCOSE, CAPILLARY
GLUCOSE-CAPILLARY: 144 mg/dL — AB (ref 70–99)
GLUCOSE-CAPILLARY: 145 mg/dL — AB (ref 70–99)
GLUCOSE-CAPILLARY: 155 mg/dL — AB (ref 70–99)
GLUCOSE-CAPILLARY: 176 mg/dL — AB (ref 70–99)
GLUCOSE-CAPILLARY: 177 mg/dL — AB (ref 70–99)
GLUCOSE-CAPILLARY: 183 mg/dL — AB (ref 70–99)
Glucose-Capillary: 136 mg/dL — ABNORMAL HIGH (ref 70–99)

## 2013-11-24 LAB — SODIUM
SODIUM: 159 meq/L — AB (ref 137–147)
Sodium: 159 mEq/L — ABNORMAL HIGH (ref 137–147)
Sodium: 160 mEq/L — ABNORMAL HIGH (ref 137–147)
Sodium: 159 mEq/L — ABNORMAL HIGH (ref 137–147)

## 2013-11-24 MED ORDER — PNEUMOCOCCAL VAC POLYVALENT 25 MCG/0.5ML IJ INJ
0.5000 mL | INJECTION | INTRAMUSCULAR | Status: AC
  Administered 2013-11-25: 0.5 mL via INTRAMUSCULAR
  Filled 2013-11-24: qty 0.5

## 2013-11-24 MED ORDER — SODIUM CHLORIDE 0.9 % IJ SOLN
10.0000 mL | Freq: Two times a day (BID) | INTRAMUSCULAR | Status: DC
  Administered 2013-11-24 – 2013-11-25 (×2): 10 mL
  Administered 2013-11-25: 20 mL
  Administered 2013-11-26: 10 mL
  Administered 2013-11-26 – 2013-11-27 (×2): 30 mL
  Administered 2013-11-27: 40 mL
  Administered 2013-11-29: 10 mL

## 2013-11-24 MED ORDER — SODIUM CHLORIDE 0.9 % IJ SOLN
10.0000 mL | INTRAMUSCULAR | Status: DC | PRN
  Administered 2013-11-28: 20 mL
  Administered 2013-11-28: 40 mL
  Administered 2013-11-29: 30 mL
  Administered 2013-11-29: 10 mL

## 2013-11-24 NOTE — Progress Notes (Signed)
Stroke Team Progress Note  HISTORY Todd Vargas is an 68 y.o. male --much of history obtained from chart--patient was LSN at 8 pm last night 11/17/2013. HE was found by a friend this morning 11/18/2013 with right eye gaze deviation, left arm hemiplegia and incontinent of stool and urine. CT head obtained on arrival shows a dense right MCA sign and evolving right MCA infarct. Currently patient is in ED, he is able to follow simple commands with his right side, able to say minimal words such as two, five yes but very dysarthric. Patient was not administerd TPA secondary to delay in arrival. Due to risk neurological worsening and impending brain herniation, he was admitted to the neuro ICU for further evaluation and treatment.  SUBJECTIVE Remains stable lethargic follows commands yet.Sodium elevated and 3 % drip off  OBJECTIVE Most recent Vital Signs: Filed Vitals:   11/23/13 2047 11/23/13 2325 11/24/13 0344 11/24/13 0713  BP: 136/99 114/77 156/83 137/75  Pulse: 117 89 128 99  Temp: 98.4 F (36.9 C) 99.7 F (37.6 C) 98.6 F (37 C) 98.9 F (37.2 C)  TempSrc: Oral Axillary Oral Axillary  Resp: 21 18 26 24   Height:      Weight:   122.8 kg (270 lb 11.6 oz)   SpO2: 98% 97% 98% 95%   CBG (last 3)   Recent Labs  11/24/13 0015 11/24/13 0343 11/24/13 0714  GLUCAP 144* 177* 176*    IV Fluid Intake:   . feeding supplement (OSMOLITE 1.5 CAL) 1,000 mL (11/24/13 0800)    MEDICATIONS  . antiseptic oral rinse  15 mL Mouth Rinse q12n4p  . aspirin  325 mg Oral Daily  . carvedilol  3.125 mg Oral BID WC  . chlorhexidine  15 mL Mouth Rinse BID  . diltiazem  60 mg Oral Q6H  . enoxaparin (LOVENOX) injection  40 mg Subcutaneous Daily  . feeding supplement (PRO-STAT SUGAR FREE 64)  30 mL Per Tube 5 X Daily  . insulin aspart  0-15 Units Subcutaneous Q4H  . irbesartan  75 mg Oral Daily  . [START ON 11/25/2013] pneumococcal 23 valent vaccine  0.5 mL Intramuscular Tomorrow-1000   PRN:  ondansetron  (ZOFRAN) IV  Diet:  NPO  Activity:  Bedrest DVT Prophylaxis:  Lovenox  CLINICALLY SIGNIFICANT STUDIES Basic Metabolic Panel:  Recent Labs Lab 11/21/13 2220 11/22/13 0100  11/23/13 0320  11/23/13 2120 11/24/13 0450  NA 157* 158*  < > 156*  < > 161* 159*  K  --  3.8  --  3.7  --   --   --   CL  --  117*  --  117*  --   --   --   CO2  --  25  --  25  --   --   --   GLUCOSE  --  154*  --  211*  --   --   --   BUN  --  25*  --  35*  --   --   --   CREATININE  --  0.86  --  0.89  --   --   --   CALCIUM  --  9.2  --  9.3  --   --   --   MG  --  2.5  --   --   --   --   --   < > = values in this interval not displayed. Liver Function Tests:   Recent Labs Lab 11/18/13 1139  AST  20  ALT 17  ALKPHOS 136*  BILITOT 0.4  PROT 7.7  ALBUMIN 4.0   CBC:  Recent Labs Lab 11/18/13 1139  11/21/13 0315 11/23/13 0320  WBC 7.3  < > 10.2 10.7*  NEUTROABS 5.5  --   --   --   HGB 13.3  < > 13.7 14.3  HCT 40.6  < > 43.2 45.3  MCV 77.9*  < > 81.4 81.6  PLT 154  < > 156 134*  < > = values in this interval not displayed. Coagulation:   Recent Labs Lab 11/18/13 1139  LABPROT 13.1  INR 1.01   Cardiac Enzymes:   Recent Labs Lab 11/18/13 1139  CKTOTAL 127  TROPONINI <0.30   Urinalysis:   Recent Labs Lab 11/19/13 0208  COLORURINE YELLOW  LABSPEC 1.026  PHURINE 6.0  GLUCOSEU NEGATIVE  HGBUR MODERATE*  BILIRUBINUR NEGATIVE  KETONESUR NEGATIVE  PROTEINUR 30*  UROBILINOGEN 0.2  NITRITE NEGATIVE  LEUKOCYTESUR SMALL*   Lipid Panel    Component Value Date/Time   CHOL 148 11/19/2013 0316   TRIG 104 11/19/2013 0316   HDL 39* 11/19/2013 0316   CHOLHDL 3.8 11/19/2013 0316   VLDL 21 11/19/2013 0316   LDLCALC 88 11/19/2013 0316   HgbA1C  Lab Results  Component Value Date   HGBA1C 6.9* 11/19/2013    Urine Drug Screen:   No results found for this basename: labopia,  cocainscrnur,  labbenz,  amphetmu,  thcu,  labbarb    Alcohol Level:   Recent Labs Lab 11/18/13 1144   ETH <11    CT of the brain   11/20/2013 . Continued interval evolution of large right MCA territory ischemic infarct with increased right-to-left subfalcine herniation, now measuring 1 cm, previously 5 mm on 11/18/2013. Asymmetric enlargement of the left lateral ventricle is slightly increased, worrisome for possible developing entrapment. 2. No evidence of hemorrhagic conversion.  11/18/2013   1. 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 There is a hyperdense right MCA concerning for thrombus.   11/23/2013- right middle cerebral artery territory infarct with overall  decreased mass effect, no hemorrhagic conversion   CT Angio Head 11/18/2013  Poor visualization of the right carotid terminus concerning for embolus propagating into the proximal right M1 and right A1 segments, however there is reconstitution distally suggesting nonocclusive embolus or early re- cannulization. Mildly attenuated right M2 and M3 appearance which may be in part due to compressive changes from right cerebral edema and evolving right MCA territory infarct. Worsening right cerebral edema, increasing right to left subfalcine herniation.  Complete circle of Willis.    CT Angio Neck  11/18/2013    No hemodynamically significant stenosis.  Suspected mediastinal lymphadenopathy, partially imaged.    MRI of the brain    MRA of the brain  See angio  2D Echocardiogram  EF 35-40% with no source of embolus. Diffuse hypokinesis  Carotid Doppler  Preliminary findings: Left = 1-39% ICA stenosis. Antegrade vertebral flow. Right = very limited views due to patient immobility to turn head. Cannot definitely determine grade of stenosis. No obvious significant stenosis noted.  CXR   11/19/2013 Persistent pulmonary infiltrates question pulmonary edema, little changed. 11/18/2013    Central line tip at the brachiocephalic venous confluence. No pneumothorax is identified.   11/18/2013  The findings suggest congestive  heart failure with pulmonary interstitial and early alveolar edema.     EKG  normal sinus rhythm. For complete results please see formal report.   Therapy Recommendations SNF  Physical Exam   Obese middle aged african [place] male  . Afebrile. Head is nontraumatic. Neck is supple without bruit.  l. Cardiac exam no murmur or gallop. Lungs are clear to auscultation. Distal pulses are well felt. Neurological Exam : Eyes closed. Pupils of 4 mm sluggishly reactive. Right gaze preference but eyes can be moved reflexively to the midline. Does not blink to threat left more than right. Left lower facial weakness. Tongue midline. Patient does not speak but will follow commands on the right side consistently. Flaccid left hemiplegia with no withdrawal to painful stimuli. Purposeful antigravity movements on the right side with good tone and strength. Left plantar upgoing right is downgoing. Decreased sensation on the left. Some left-sided neglect. Gait not tested.  ASSESSMENT [name] is a 68 y.o. male presenting with right eye deviation, left arm hemiplegia and incontinence of stool and urine. Imaging confirms a right MCA infarct with cytotoxic cerebral edema, increasing overnight from 74mm ->10 mm midline shift and right to left subfalcine herniation, concern for potential ventricle entrapment. On hypertonic saline in order to decrease cerebral edema. Infarct felt to be embolic secondary to terminal ICA occlusion from new onset atrial fibrillation.  On no antithrombotics prior to admission. Now on aspirin 300 mg suppository daily for secondary stroke prevention. Patient with resultant lethargy, left hemiparesis, mutism, dysphagia, able to follow 1-step commands. Work up underway.    Dr. Danielle Dess, Neuro Surgery, does not feel decompressive craniotomy is in his best interest  Induced hypernatremia with 3% saline in order to decrease cerebral edema, Na 158   Atrial Fibrillation - new onset yesterday, not  an oral anticoagulation candidate at this time due to NPO status. P[laced on cardizem for rate control. CCM added Add Coreg 3.125 / Avapro 75.   CHA2DS2-VASc Score for Atrial Fibrillation Stroke Risk = 5  (?2 oral anticoagulation recommended);  Yearly Stroke risk:  6.7%   Age in Years:  46-74   +1    Sex:  Male   0    Congestive Heart Failure History:  No  0    Hypertension History:  yes   +1    Stroke/TIA/Thromboembolism History:  yes   +2   Vascular Disease History:  No  0    Diabetes Mellitus:  yes   +1 hypertension Hyperlipidemia, LDL 88, on no statin PTA, now on no statin as NPO, goal LDL < 70 for diabetics Diabetes, HgbA1c 6.9, goal < 7.0 Obesity, Body mass index is 33.84 kg/(m^2). Hypokalemia, replaced NG tube ordered Ejection fraction 35-40% by echo.   Family has opted for no intubation - limited DNR   Hospital day # 6  TREATMENT/PLAN  Continue  aspirin 300 mg suppository for secondary stroke prevention.  Na goal 150-155. Will continue protocol and follow Na. Currently at 159.   Repeat CTH with evolving R MCA infarct but decrease overall mass affect.   Hold 3% until NA <150  On oral Cardizem 60 q6 rate 90s-120s better controlled this AM  No family at bedside   Case management on board and spoke to pt's family.   I have personally obtained a history, examined the patient, evaluated imaging results, and formulated the assessment and plan of care. I agree with the above. Pauletta Browns

## 2013-11-25 LAB — SODIUM
SODIUM: 162 meq/L — AB (ref 137–147)
Sodium: 161 mEq/L — ABNORMAL HIGH (ref 137–147)

## 2013-11-25 LAB — GLUCOSE, CAPILLARY
GLUCOSE-CAPILLARY: 182 mg/dL — AB (ref 70–99)
GLUCOSE-CAPILLARY: 215 mg/dL — AB (ref 70–99)
GLUCOSE-CAPILLARY: 175 mg/dL — AB (ref 70–99)
Glucose-Capillary: 177 mg/dL — ABNORMAL HIGH (ref 70–99)
Glucose-Capillary: 172 mg/dL — ABNORMAL HIGH (ref 70–99)
Glucose-Capillary: 175 mg/dL — ABNORMAL HIGH (ref 70–99)

## 2013-11-25 MED ORDER — DILTIAZEM 12 MG/ML ORAL SUSPENSION
90.0000 mg | Freq: Four times a day (QID) | ORAL | Status: DC
  Administered 2013-11-25 – 2013-11-30 (×19): 90 mg via ORAL
  Filled 2013-11-25 (×23): qty 9

## 2013-11-25 MED ORDER — FREE WATER
200.0000 mL | Freq: Four times a day (QID) | Status: DC
  Administered 2013-11-25 – 2013-11-26 (×4): 200 mL

## 2013-11-25 NOTE — Clinical Social Work Note (Signed)
Message left with niece. CSW waiting for callback.  Roddie Mc, [place], [place], [phone]

## 2013-11-25 NOTE — Progress Notes (Signed)
Stroke Team Progress Note  HISTORY Todd Vargas is an [age] y.o. male --much of history obtained from chart--patient was LSN at 8 pm last night 11/17/2013. HE was found by a friend this morning 11/18/2013 with right eye gaze deviation, left arm hemiplegia and incontinent of stool and urine. CT head obtained on arrival shows a dense right MCA sign and evolving right MCA infarct. Currently patient is in ED, he is able to follow simple commands with his right side, able to say minimal words such as two, five yes but very dysarthric. Patient was not administerd TPA secondary to delay in arrival. Due to risk neurological worsening and impending brain herniation, he was admitted to the neuro ICU for further evaluation and treatment.  SUBJECTIVE Lying in bed. No family at bedside.  OBJECTIVE Most recent Vital Signs: Filed Vitals:   11/24/13 2314 11/25/13 0334 11/25/13 0714 11/25/13 0811  BP:  118/68  153/88  Pulse: 117 132  111  Temp:  98.1 F (36.7 C) 97 F (36.1 C) 97.1 F (36.2 C)  TempSrc:  Oral Axillary Axillary  Resp: 28 23  28   Height:      Weight:  124 kg (273 lb 5.9 oz)    SpO2: 96% 96%  96%   CBG (last 3)   Recent Labs  11/24/13 2310 11/25/13 0332 11/25/13 0718  GLUCAP 177* 182* 172*    IV Fluid Intake:   . feeding supplement (OSMOLITE 1.5 CAL) 1,000 mL (11/25/13 0900)    MEDICATIONS  . antiseptic oral rinse  15 mL Mouth Rinse q12n4p  . aspirin  325 mg Oral Daily  . carvedilol  3.125 mg Oral BID WC  . chlorhexidine  15 mL Mouth Rinse BID  . diltiazem  60 mg Oral Q6H  . enoxaparin (LOVENOX) injection  40 mg Subcutaneous Daily  . feeding supplement (PRO-STAT SUGAR FREE 64)  30 mL Per Tube 5 X Daily  . insulin aspart  0-15 Units Subcutaneous Q4H  . irbesartan  75 mg Oral Daily  . pneumococcal 23 valent vaccine  0.5 mL Intramuscular Tomorrow-1000  . sodium chloride  10-40 mL Intracatheter Q12H   PRN:  ondansetron (ZOFRAN) IV, sodium chloride  Diet:  NPO  Activity:   Bedrest DVT Prophylaxis:  Lovenox  CLINICALLY SIGNIFICANT STUDIES Basic Metabolic Panel:  Recent Labs Lab 11/21/13 2220 11/22/13 0100  11/23/13 0320  11/24/13 2245 11/25/13 0320  NA 157* 158*  < > 156*  < > 159* 162*  K  --  3.8  --  3.7  --   --   --   CL  --  117*  --  117*  --   --   --   CO2  --  25  --  25  --   --   --   GLUCOSE  --  154*  --  211*  --   --   --   BUN  --  25*  --  35*  --   --   --   CREATININE  --  0.86  --  0.89  --   --   --   CALCIUM  --  9.2  --  9.3  --   --   --   MG  --  2.5  --   --   --   --   --   < > = values in this interval not displayed. Liver Function Tests:   Recent Labs Lab 11/18/13 1139  AST  20  ALT 17  ALKPHOS 136*  BILITOT 0.4  PROT 7.7  ALBUMIN 4.0   CBC:  Recent Labs Lab 11/18/13 1139  11/21/13 0315 11/23/13 0320  WBC 7.3  < > 10.2 10.7*  NEUTROABS 5.5  --   --   --   HGB 13.3  < > 13.7 14.3  HCT 40.6  < > 43.2 45.3  MCV 77.9*  < > 81.4 81.6  PLT 154  < > 156 134*  < > = values in this interval not displayed. Coagulation:   Recent Labs Lab 11/18/13 1139  LABPROT 13.1  INR 1.01   Cardiac Enzymes:   Recent Labs Lab 11/18/13 1139  CKTOTAL 127  TROPONINI <0.30   Urinalysis:   Recent Labs Lab 11/19/13 0208  COLORURINE YELLOW  LABSPEC 1.026  PHURINE 6.0  GLUCOSEU NEGATIVE  HGBUR MODERATE*  BILIRUBINUR NEGATIVE  KETONESUR NEGATIVE  PROTEINUR 30*  UROBILINOGEN 0.2  NITRITE NEGATIVE  LEUKOCYTESUR SMALL*   Lipid Panel    Component Value Date/Time   CHOL 148 11/19/2013 0316   TRIG 104 11/19/2013 0316   HDL 39* 11/19/2013 0316   CHOLHDL 3.8 11/19/2013 0316   VLDL 21 11/19/2013 0316   LDLCALC 88 11/19/2013 0316   HgbA1C  Lab Results  Component Value Date   HGBA1C 6.9* 11/19/2013    Urine Drug Screen:   No results found for this basename: labopia,  cocainscrnur,  labbenz,  amphetmu,  thcu,  labbarb    Alcohol Level:   Recent Labs Lab 11/18/13 1144  ETH <11    CT of the brain    11/23/2013- right middle cerebral artery territory infarct with overall  decreased mass effect, no hemorrhagic conversion 11/20/2013 . Continued interval evolution of large right MCA territory ischemic infarct with increased right-to-left subfalcine herniation, now measuring 1 cm, previously 5 mm on 11/18/2013. Asymmetric enlargement of the left lateral ventricle is slightly increased, worrisome for possible developing entrapment. 2. No evidence of hemorrhagic conversion.  11/18/2013   1. 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 There is a hyperdense right MCA concerning for thrombus.   CT Angio Head 11/18/2013  Poor visualization of the right carotid terminus concerning for embolus propagating into the proximal right M1 and right A1 segments, however there is reconstitution distally suggesting nonocclusive embolus or early re- cannulization. Mildly attenuated right M2 and M3 appearance which may be in part due to compressive changes from right cerebral edema and evolving right MCA territory infarct. Worsening right cerebral edema, increasing right to left subfalcine herniation.  Complete circle of Willis.    CT Angio Neck  11/18/2013    No hemodynamically significant stenosis.  Suspected mediastinal lymphadenopathy, partially imaged.    MRI of the brain    MRA of the brain  See angio  2D Echocardiogram  EF 35-40% with no source of embolus. Diffuse hypokinesis  Carotid Doppler  Preliminary findings: Left = 1-39% ICA stenosis. Antegrade vertebral flow. Right = very limited views due to patient immobility to turn head. Cannot definitely determine grade of stenosis. No obvious significant stenosis noted.  CXR   11/19/2013 Persistent pulmonary infiltrates question pulmonary edema, little changed. 11/18/2013    Central line tip at the brachiocephalic venous confluence. No pneumothorax is identified.   11/18/2013  The findings suggest congestive heart failure with pulmonary  interstitial and early alveolar edema.     EKG  normal sinus rhythm. For complete results please see formal report.   Therapy Recommendations SNF  Physical Exam  Obese middle aged african [place] male  . Afebrile. Head is nontraumatic. Neck is supple without bruit.  l. Cardiac exam no murmur or gallop. Lungs are clear to auscultation. Distal pulses are well felt. Neurological Exam : Eyes closed. Pupils of 4 mm sluggishly reactive. Right gaze preference but eyes can be moved reflexively to the midline. Does not blink to threat left more than right. Left lower facial weakness. Tongue midline. Patient does not speak but will follow commands on the right side consistently. Flaccid left hemiplegia with no withdrawal to painful stimuli. Purposeful antigravity movements on the right side with good tone and strength. Left plantar upgoing right is downgoing. Decreased sensation on the left. Some left-sided neglect. Gait not tested.  ASSESSMENT Todd Vargas is a 68 y.o. male presenting with right eye deviation, left arm hemiplegia and incontinence of stool and urine. Imaging confirms a right MCA infarct with cytotoxic cerebral edema, increasing overnight from 18mm ->10 mm midline shift and right to left subfalcine herniation, concern for potential ventricle entrapment. On hypertonic saline in order to decrease cerebral edema. Infarct felt to be embolic secondary to terminal ICA occlusion from new onset atrial fibrillation.  On no antithrombotics prior to admission. Now on aspirin 325 mg orally every day for secondary stroke prevention. Patient with resultant lethargy, left hemiparesis, mutism, dysphagia, able to follow 1-step commands. Work up underway.    Dr. Danielle Dess, Neuro Surgery, does not feel decompressive craniotomy is in his best interest  Induced hypernatremia with 3% saline in order to decrease cerebral edema, Na 159 and continues to rise   Atrial Fibrillation - new onset yesterday, not an oral  anticoagulation candidate at this time due to NPO status. P[laced on cardizem for rate control. CCM added Add Coreg 3.125 / Avapro 75.   CHA2DS2-VASc Score for Atrial Fibrillation Stroke Risk = 5  (?2 oral anticoagulation recommended);  Yearly Stroke risk:  6.7%   Age in Years:  92-74   +1    Sex:  Male   0    Congestive Heart Failure History:  No  0    Hypertension History:  yes   +1    Stroke/TIA/Thromboembolism History:  yes   +2   Vascular Disease History:  No  0    Diabetes Mellitus:  yes   +1 hypertension Hyperlipidemia, LDL 88, on no statin PTA, now on no statin as NPO, goal LDL < 70 for diabetics Diabetes, HgbA1c 6.9, goal < 7.0 Obesity, Body mass index is 34.17 kg/(m^2). Hypokalemia, replaced Ejection fraction 35-40% by echo.  Family has opted for no intubation - limited DNR   Hospital day # 7  TREATMENT/PLAN  Continue  aspirin 325 mg orally every day for secondary stroke prevention.  Add free water, q 6 to help lower sodium. Continue to check, will decrease frequency to daily  Increase oral Cardizem to 90 q 6. Follow HR  Consider diabetic med - not on anything prior to admission. Follow glucose  Consider PEG tube  Annie Main, MSN, RN, ANVP-BC, ANP-BC, GNP-BC [facility] Pager: [phone] 11/25/2013 10:16 AM  I have personally obtained a history, examined the patient, evaluated imaging results, and formulated the assessment and plan of care. I agree with the above.  Delia Heady, MD

## 2013-11-25 NOTE — Progress Notes (Signed)
Utilization review completed.  

## 2013-11-25 NOTE — Progress Notes (Signed)
Physical Therapy Treatment Patient Details Name: Todd Vargas MRN: [id number] DOB: 04/13/1946 Today's Date: 11/25/2013 Time: 5093-2671 PT Time Calculation (min): 27 min  PT Assessment / Plan / Recommendation  History of Present Illness 68 yo with 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   PT Comments   Pt con't to have no voluntary L UE/LE mvmt or response to noxious stimuli. Pt with improved attn to task however minimal. Pt con't to be dependent for all transfers. Pt remains appropriate for SNF upon d/c.   Follow Up Recommendations  SNF;Supervision/Assistance - 24 hour     Does the patient have the potential to tolerate intense rehabilitation     Barriers to Discharge        Equipment Recommendations       Recommendations for Other Services    Frequency Min 3X/week   Progress towards PT Goals Progress towards PT goals: Progressing toward goals  Plan Current plan remains appropriate    Precautions / Restrictions Precautions Precautions: Fall Restrictions Weight Bearing Restrictions: No   Pertinent Vitals/Pain Unable to report, pt HR into 130s-140s during session    Mobility  Bed Mobility Overal bed mobility: +2 for physical assistance;Needs Assistance Bed Mobility: Sit to Supine;Supine to Sit;Rolling Rolling: Total assist Supine to sit: +2 for physical assistance;Total assist;HOB elevated Sit to supine: HOB elevated;+2 for physical assistance;+2 for safety/equipment;Total assist General bed mobility comments: with max tactile cues pt used L UE to assist with transfer Transfers Overall transfer level: Needs assistance Transfers: Sit to/from Stand Sit to Stand: Total assist;+2 physical assistance General transfer comment: unable to achieve full standing, minimal cleared bottom. pt with not assist with transfer Modified Rankin (Stroke Patients Only) Pre-Morbid Rankin Score: No symptoms Modified Rankin: Severe disability    Exercises Other Exercises Other Exercises: attempted  to complete R LE LAQ, pt unable to stay on task for more than 2 reps   PT Diagnosis:    PT Problem List:   PT Treatment Interventions:     PT Goals (current goals can now be found in the care plan section)    Visit Information  Last PT Received On: 11/25/13 Assistance Needed: +2 History of Present Illness: 68 yo with 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    Subjective Data      Cognition  Cognition Arousal/Alertness: Awake/alert Behavior During Therapy: Flat affect Overall Cognitive Status: Impaired/Different from baseline Area of Impairment: Following commands;Problem solving;Attention Current Attention Level: Focused Following Commands: Follows one step commands inconsistently;Follows one step commands with increased time (with R UE only) Problem Solving: Slow processing;Decreased initiation;Difficulty sequencing;Requires verbal cues;Requires tactile cues General Comments: L sided neglect, able to complete functional tasks with R UE     Balance  Balance Overall balance assessment: Needs assistance Sitting-balance support: Feet supported Sitting balance-Leahy Scale: Poor Sitting balance - Comments: pt with increased trunk flexion, required maximal assist to maintain upright position Postural control: Posterior lean General Comments General comments (skin integrity, edema, etc.): pt able to "brush teeth" with mouth sponge with R UE  End of Session PT - End of Session Equipment Utilized During Treatment: Gait belt Activity Tolerance: Patient limited by fatigue;Treatment limited secondary to medical complications (Comment) Patient left: in bed;with call bell/phone within reach;with restraints reapplied;Other (comment) (R mit replaced) Nurse Communication: Mobility status;Other (comment)   GP     Danica Camarena Hilda Lias 11/25/2013, 11:57 AM  Lewis Shock, PT, DPT Pager #: [phone] Office #: [phone]

## 2013-11-25 NOTE — Progress Notes (Signed)
Speech Language Pathology Treatment: Dysphagia  Patient Details Name: Todd Vargas MRN: [id number] DOB: [birth year] Today's Date: 11/25/2013 Time: 1352-1400 SLP Time Calculation (min): 8 min  Assessment / Plan / Recommendation Clinical Impression  Treatment focused on diagnostic po trials. Oral care complete. Patient lethargic however aroused with moderate tactile cueing and was able to follow commands related to oral movements with moderate cueing. Once po trials initiated however (ice chips) patient with an increase in lethargy, no awareness of or acceptance of bolus. Patient not ready cognitively for pos at this time. Will continue to f/u.    HPI HPI: 68 yo admitted on 1/26 w/ history of DM, HTN.  CT revealed continued interval evolution of large right MCA territory ischemic infarct with increased right-to-left subfalcine herniation, now measuring 1 cm, previously 5 mm on 11/18/2013. Deficits included: severe dysarthria and left sided neglect. PCCM asked to assess for CVL placement in effort to provide hypertonic saline and out of concern for inability to protect airway / impending respiratory failure.  Pt. did not require intubation.      SLP Plan  Continue with current plan of care    Recommendations Diet recommendations: NPO Medication Administration: Via alternative means              Oral Care Recommendations: Oral care Q4 per protocol Follow up Recommendations: Skilled Nursing facility Plan: Continue with current plan of care    GO   [facility] MA, CCC-SLP [phone]    Todd Vargas Todd Vargas 11/25/2013, 2:06 PM

## 2013-11-26 ENCOUNTER — Encounter (HOSPITAL_COMMUNITY): Payer: Self-pay | Admitting: *Deleted

## 2013-11-26 DIAGNOSIS — R1310 Dysphagia, unspecified: Secondary | ICD-10-CM

## 2013-11-26 LAB — GLUCOSE, CAPILLARY
GLUCOSE-CAPILLARY: 168 mg/dL — AB (ref 70–99)
GLUCOSE-CAPILLARY: 161 mg/dL — AB (ref 70–99)
GLUCOSE-CAPILLARY: 156 mg/dL — AB (ref 70–99)
Glucose-Capillary: 187 mg/dL — ABNORMAL HIGH (ref 70–99)
Glucose-Capillary: 165 mg/dL — ABNORMAL HIGH (ref 70–99)
Glucose-Capillary: 161 mg/dL — ABNORMAL HIGH (ref 70–99)

## 2013-11-26 LAB — SODIUM: Sodium: 160 mEq/L — ABNORMAL HIGH (ref 137–147)

## 2013-11-26 MED ORDER — FREE WATER
200.0000 mL | Status: AC
  Administered 2013-11-26 – 2013-11-28 (×10): 200 mL

## 2013-11-26 NOTE — Consult Note (Signed)
Patient examined and I agree with the assessment and plan I spoke to his great niece on the phone. Her mother, [name], is the patient's niece and will be coming to the hospital today. I will discuss the procedure/risks/benefits of PEG placement with her at that time.PEG scheduled 2/5 in Endoscopy. Violeta Gelinas, MD, MPH, FACS Pager: [phone]  11/26/2013 1:11 PM

## 2013-11-26 NOTE — Clinical Social Work Placement (Addendum)
Clinical Social Work Department CLINICAL SOCIAL WORK PLACEMENT NOTE 11/26/2013  Patient:  LOKI, PRYDE  Account Number:  [id number] Admit date:  11/18/2013  Clinical Social Worker:  Cherre Blanc, [place]  Date/time:  11/26/2013 03:00 PM  Clinical Social Work is seeking post-discharge placement for this patient at the following level of care:   SKILLED NURSING   (*CSW will update this form in Epic as items are completed)   11/23/2013  Patient/family provided with [facility] Department of Clinical Social Work's list of facilities offering this level of care within the geographic area requested by the patient (or if unable, by the patient's family).  11/22/2013  Patient/family informed of their freedom to choose among providers that offer the needed level of care, that participate in Medicare, Medicaid or managed care program needed by the patient, have an available bed and are willing to accept the patient.  11/22/2013  Patient/family informed of [facility]' ownership interest in [facility] as well as of the fact that they are under no obligation to receive care at this facility.  PASARR submitted to EDS on 11/22/2013 PASARR number received from EDS on 11/22/2013  FL2 transmitted to all facilities in geographic area requested by pt/family on  11/22/2013 FL2 transmitted to all facilities within larger geographic area on   Patient informed that his/her managed care company has contracts with or will negotiate with  certain facilities, including the following:     Patient/family informed of bed offers received:  11/26/2013 Patient chooses bed at  Physician recommends and patient chooses bed at    Patient to be transferred to [facility] on 11/30/13- Jetta Lout, LCSWA  Patient to be transferred to facility by [facility]- Jetta Lout, LCSWA   The following physician request were entered in Epic:   Additional Comments:   [name] provided with bed offers.   Roddie Mc, [place], [place], [phone]

## 2013-11-26 NOTE — Progress Notes (Signed)
SLP Cancellation Note  Patient Details Name: Ashanti Packman MRN: [id number] DOB: 08/24/1946   Cancelled treatment:       Reason Eval/Treat Not Completed: Fatigue/lethargy limiting ability to participate.  MD note states niece has consented to PEG.  Will follow.   Blenda Mounts Laurice 11/26/2013, 10:30 AM

## 2013-11-26 NOTE — Progress Notes (Signed)
Patient ID: Todd Vargas, male   DOB: 08-05-1946, 68 y.o.   MRN: [id number] I spoke with [name], his niece on the phone. I discussed the procedure/risks/and benefits of PEG placement. She agrees. Phone consent documented. Violeta Gelinas, MD, MPH, FACS Pager: [phone]

## 2013-11-26 NOTE — Consult Note (Signed)
Reason for Consult:Dysphagia/PEG tube placement Referring Physician: [name] is an 68 y.o. male.  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 He has been unable to pass a swallow evaluation and we were asked for PEG tube placement to facilitate SNF placement.   Past Medical History  Diagnosis Date  . Diabetes mellitus without complication   . HTN (hypertension)   . Hyperlipidemia     History reviewed. No pertinent past surgical history.  History reviewed. No pertinent family history.  Social History:  has no tobacco, alcohol, and drug history on file.  Allergies: No Known Allergies  Medications: I have reviewed the patient's current medications.  Results for orders placed during the hospital encounter of 11/18/13 (from the past 48 hour(s))  GLUCOSE, CAPILLARY     Status: Abnormal   Collection Time    11/24/13 10:56 AM      Result Value Range   Glucose-Capillary 136 (*) 70 - 99 mg/dL   Comment 1 Notify RN     Comment 2 Documented in Chart    GLUCOSE, CAPILLARY     Status: Abnormal   Collection Time    11/24/13  3:05 PM      Result Value Range   Glucose-Capillary 155 (*) 70 - 99 mg/dL   Comment 1 Notify RN     Comment 2 Documented in Chart    SODIUM     Status: Abnormal   Collection Time    11/24/13  3:10 PM      Result Value Range   Sodium 159 (*) 137 - 147 mEq/L  GLUCOSE, CAPILLARY     Status: Abnormal   Collection Time    11/24/13  3:52 PM      Result Value Range   Glucose-Capillary 145 (*) 70 - 99 mg/dL  GLUCOSE, CAPILLARY     Status: Abnormal   Collection Time    11/24/13  7:18 PM      Result Value Range   Glucose-Capillary 183 (*) 70 - 99 mg/dL   Comment 1 Notify RN    SODIUM     Status: Abnormal   Collection Time    11/24/13 10:45 PM      Result Value Range   Sodium 159 (*) 137 - 147 mEq/L  GLUCOSE, CAPILLARY     Status: Abnormal   Collection Time    11/24/13 11:10 PM      Result Value Range    Glucose-Capillary 177 (*) 70 - 99 mg/dL   Comment 1 Notify RN    SODIUM     Status: Abnormal   Collection Time    11/25/13  3:20 AM      Result Value Range   Sodium 162 (*) 137 - 147 mEq/L  GLUCOSE, CAPILLARY     Status: Abnormal   Collection Time    11/25/13  3:32 AM      Result Value Range   Glucose-Capillary 182 (*) 70 - 99 mg/dL   Comment 1 Notify RN    GLUCOSE, CAPILLARY     Status: Abnormal   Collection Time    11/25/13  7:18 AM      Result Value Range   Glucose-Capillary 172 (*) 70 - 99 mg/dL   Comment 1 Documented in Chart     Comment 2 Notify RN    SODIUM     Status: Abnormal   Collection Time    11/25/13  9:20 AM  Result Value Range   Sodium 161 (*) 137 - 147 mEq/L  GLUCOSE, CAPILLARY     Status: Abnormal   Collection Time    11/25/13 10:57 AM      Result Value Range   Glucose-Capillary 215 (*) 70 - 99 mg/dL   Comment 1 Notify RN     Comment 2 Documented in Chart    GLUCOSE, CAPILLARY     Status: Abnormal   Collection Time    11/25/13  3:07 PM      Result Value Range   Glucose-Capillary 175 (*) 70 - 99 mg/dL   Comment 1 Notify RN     Comment 2 Documented in Chart    GLUCOSE, CAPILLARY     Status: Abnormal   Collection Time    11/25/13  7:30 PM      Result Value Range   Glucose-Capillary 175 (*) 70 - 99 mg/dL   Comment 1 Documented in Chart     Comment 2 Notify RN    GLUCOSE, CAPILLARY     Status: Abnormal   Collection Time    11/25/13 11:07 PM      Result Value Range   Glucose-Capillary 168 (*) 70 - 99 mg/dL   Comment 1 Documented in Chart     Comment 2 Notify RN    GLUCOSE, CAPILLARY     Status: Abnormal   Collection Time    11/26/13  3:10 AM      Result Value Range   Glucose-Capillary 161 (*) 70 - 99 mg/dL   Comment 1 Documented in Chart     Comment 2 Notify RN    SODIUM     Status: Abnormal   Collection Time    11/26/13  4:50 AM      Result Value Range   Sodium 160 (*) 137 - 147 mEq/L  GLUCOSE, CAPILLARY     Status: Abnormal    Collection Time    11/26/13  8:32 AM      Result Value Range   Glucose-Capillary 187 (*) 70 - 99 mg/dL   Comment 1 Documented in Chart     Comment 2 Notify RN      No results found.  Review of Systems  Unable to perform ROS: mental acuity   Blood pressure 115/64, pulse 64, temperature 97.7 F (36.5 C), temperature source Oral, resp. rate 22, height 6\' 3"  (1.905 m), weight 269 lb 10 oz (122.3 kg), SpO2 93.00%. Physical Exam  Vitals reviewed. Constitutional: He appears well-developed and well-nourished. He appears lethargic. No distress.  HENT:  Head: Normocephalic and atraumatic.  Eyes: Conjunctivae are normal. Right eye exhibits no discharge. Left eye exhibits no discharge. No scleral icterus.  Neck: Neck supple.  Cardiovascular: Normal rate, regular rhythm, normal heart sounds and intact distal pulses.  Exam reveals no gallop and no friction rub.   No murmur heard. Respiratory: Effort normal and breath sounds normal. No respiratory distress. He has no wheezes. He has no rales.  GI: Soft. Bowel sounds are normal. He exhibits no distension.  Musculoskeletal: He exhibits no edema and no tenderness.  Neurological: He appears lethargic.  Skin: Skin is warm and dry.    Assessment/Plan: Dysphagia 2/2 CVA -- Will plan for PEG tube placement on Thursday in Endoscopy. Will contact niece to discuss risk/benefit, etc though it is my understanding that she has already given consent.    Freeman CaldronMichael J. Willis Holquin, PA-C Pager: [phone] General Trauma PA Pager: [phone]  11/26/2013, 10:34 AM

## 2013-11-26 NOTE — Progress Notes (Signed)
NUTRITION FOLLOW-UP  DOCUMENTATION CODES Per approved criteria  -Obesity Unspecified   INTERVENTION: Continue Osmolite 1.5 @ 50 ml/hr with 30 ml Prostat five times per day. Tube feeding regimen provides 2300 kcal, 150 grams of protein, and 914 ml of H2O.   Recommend more aggressive bowel regimen, pt has not had a BM since admission.  RD to continue to follow nutrition care plan.   NUTRITION DIAGNOSIS: Inadequate oral intake related to inability to eat as evidenced by NPO status. Ongoing.  Goal: Pt to meet >/= 90% of their estimated nutrition needs. Met.  Monitor:  TF initiation and tolerance, weight trend, labs  ASSESSMENT: Pt admitted with right MCA infarct with cytotoxic cerebral edema, midline shift increasing. Per MD note infarct felt to be embolic secondary to terminal ICA occlusion from new onset atrial fibrillation.   Pt failed swallow evaluation, order received to begin enteral nutrition. Enteral nutrition initiated 1/29. Pt pulled NGT out partially on 1/31, tube reinserted; KUB found feeding tube tip in the fundus of the stomach.  Family has agreed to PEG placement. Surgery has evaluated and plans to place PEG in endo on Thursday (2/5.)  Sodium remains elevated at 160. Pt with 200 ml free water q 4 hours. Provides 1200 ml daily. Potassium and magnesium WNL. CBG's: 168, 161, 187  No BM since admission - discussed with RN.  Height: Ht Readings from Last 1 Encounters:  11/26/13 6' 3"  (1.905 m)    Weight: Wt Readings from Last 1 Encounters:  11/26/13 269 lb 10 oz (122.3 kg)  Admit wt 290 lb  BMI:  Body mass index is 33.7 kg/(m^2). Obese Class I  Estimated Nutritional Needs: Kcal: 2100-2300 Protein: 140-160 Fluid: > 2 L/day  Skin: no issues noted  Diet Order: NPO  EDUCATION NEEDS: -No education needs identified at this time   Intake/Output Summary (Last 24 hours) at 11/26/13 1101 Last data filed at 11/26/13 0900  Gross per 24 hour  Intake    970 ml   Output   1100 ml  Net   -130 ml    Last BM: PTA   Labs:   Recent Labs Lab 11/21/13 0315  11/21/13 2220 11/22/13 0100  11/23/13 0320  11/25/13 0320 11/25/13 0920 11/26/13 0450  NA 152*  < > 157* 158*  < > 156*  < > 162* 161* 160*  K 3.3*  --   --  3.8  --  3.7  --   --   --   --   CL 113*  --   --  117*  --  117*  --   --   --   --   CO2 26  --   --  25  --  25  --   --   --   --   BUN 14  --   --  25*  --  35*  --   --   --   --   CREATININE 0.70  --   --  0.86  --  0.89  --   --   --   --   CALCIUM 9.2  --   --  9.2  --  9.3  --   --   --   --   MG  --   --   --  2.5  --   --   --   --   --   --   GLUCOSE 153*  --   --  154*  --  211*  --   --   --   --   < > = values in this interval not displayed.  CBG (last 3)   Recent Labs  11/25/13 2307 11/26/13 0310 11/26/13 0832  GLUCAP 168* 161* 187*   Lab Results  Component Value Date   HGBA1C 6.9* 11/19/2013    Scheduled Meds: . antiseptic oral rinse  15 mL Mouth Rinse q12n4p  . aspirin  325 mg Oral Daily  . carvedilol  3.125 mg Oral BID WC  . chlorhexidine  15 mL Mouth Rinse BID  . diltiazem  90 mg Oral Q6H  . enoxaparin (LOVENOX) injection  40 mg Subcutaneous Daily  . feeding supplement (PRO-STAT SUGAR FREE 64)  30 mL Per Tube 5 X Daily  . free water  200 mL Per Tube Q4H  . insulin aspart  0-15 Units Subcutaneous Q4H  . irbesartan  75 mg Oral Daily  . sodium chloride  10-40 mL Intracatheter Q12H    Continuous Infusions: . feeding supplement (OSMOLITE 1.5 CAL) 1,000 mL (11/26/13 0914)    Inda Coke MS, RD, LDN Pager: [phone] After-hours pager: [phone]

## 2013-11-26 NOTE — Progress Notes (Signed)
Stroke Team Progress Note  HISTORY Todd Vargas is an 68 y.o. male --much of history obtained from chart--patient was LSN at 8 pm last night 11/17/2013. HE was found by a friend this morning 11/18/2013 with right eye gaze deviation, left arm hemiplegia and incontinent of stool and urine. CT head obtained on arrival shows a dense right MCA sign and evolving right MCA infarct. Currently patient is in ED, he is able to follow simple commands with his right side, able to say minimal words such as two, five yes but very dysarthric. Patient was not administerd TPA secondary to delay in arrival. Due to risk neurological worsening and impending brain herniation, he was admitted to the neuro ICU for further evaluation and treatment.  SUBJECTIVE No family at bedside. Dr. Pearlean Brownie called and spoke with niece Todd Vargas who has agreed to PEG placement.  OBJECTIVE Most recent Vital Signs: Filed Vitals:   11/25/13 2302 11/26/13 0257 11/26/13 0502 11/26/13 0708  BP: 111/70 98/54 115/64   Pulse: 70 64 64   Temp: 98.3 F (36.8 C) 97.4 F (36.3 C)  97.7 F (36.5 C)  TempSrc: Axillary Axillary  Oral  Resp: 19 20 22    Height:  6\' 3"  (1.905 m)    Weight:  122.3 kg (269 lb 10 oz)    SpO2: 93% 96% 93%    CBG (last 3)   Recent Labs  11/25/13 2307 11/26/13 0310 11/26/13 0832  GLUCAP 168* 161* 187*    IV Fluid Intake:   . feeding supplement (OSMOLITE 1.5 CAL) 1,000 mL (11/26/13 0914)    MEDICATIONS  . antiseptic oral rinse  15 mL Mouth Rinse q12n4p  . aspirin  325 mg Oral Daily  . carvedilol  3.125 mg Oral BID WC  . chlorhexidine  15 mL Mouth Rinse BID  . diltiazem  90 mg Oral Q6H  . enoxaparin (LOVENOX) injection  40 mg Subcutaneous Daily  . feeding supplement (PRO-STAT SUGAR FREE 64)  30 mL Per Tube 5 X Daily  . free water  200 mL Per Tube Q6H  . insulin aspart  0-15 Units Subcutaneous Q4H  . irbesartan  75 mg Oral Daily  . sodium chloride  10-40 mL Intracatheter Q12H   PRN:  ondansetron (ZOFRAN) IV,  sodium chloride  Diet:  NPO  Activity:   DVT Prophylaxis:  Lovenox  CLINICALLY SIGNIFICANT STUDIES Basic Metabolic Panel:  Recent Labs Lab 11/21/13 2220 11/22/13 0100  11/23/13 0320  11/25/13 0920 11/26/13 0450  NA 157* 158*  < > 156*  < > 161* 160*  K  --  3.8  --  3.7  --   --   --   CL  --  117*  --  117*  --   --   --   CO2  --  25  --  25  --   --   --   GLUCOSE  --  154*  --  211*  --   --   --   BUN  --  25*  --  35*  --   --   --   CREATININE  --  0.86  --  0.89  --   --   --   CALCIUM  --  9.2  --  9.3  --   --   --   MG  --  2.5  --   --   --   --   --   < > = values in this interval not  displayed. Liver Function Tests:  No results found for this basename: AST, ALT, ALKPHOS, BILITOT, PROT, ALBUMIN,  in the last 168 hours CBC:   Recent Labs Lab 11/21/13 0315 11/23/13 0320  WBC 10.2 10.7*  HGB 13.7 14.3  HCT 43.2 45.3  MCV 81.4 81.6  PLT 156 134*   Coagulation:  No results found for this basename: LABPROT, INR,  in the last 168 hours Cardiac Enzymes:  No results found for this basename: CKTOTAL, CKMB, CKMBINDEX, TROPONINI,  in the last 168 hours Urinalysis:  No results found for this basename: COLORURINE, APPERANCEUR, LABSPEC, PHURINE, GLUCOSEU, HGBUR, BILIRUBINUR, KETONESUR, PROTEINUR, UROBILINOGEN, NITRITE, LEUKOCYTESUR,  in the last 168 hours Lipid Panel    Component Value Date/Time   CHOL 148 11/19/2013 0316   TRIG 104 11/19/2013 0316   HDL 39* 11/19/2013 0316   CHOLHDL 3.8 11/19/2013 0316   VLDL 21 11/19/2013 0316   LDLCALC 88 11/19/2013 0316   HgbA1C  Lab Results  Component Value Date   HGBA1C 6.9* 11/19/2013    Urine Drug Screen:   No results found for this basename: labopia,  cocainscrnur,  labbenz,  amphetmu,  thcu,  labbarb    Alcohol Level:  No results found for this basename: ETH,  in the last 168 hours  CT of the brain   11/23/2013- right middle cerebral artery territory infarct with overall  decreased mass effect, no hemorrhagic  conversion 11/20/2013 . Continued interval evolution of large right MCA territory ischemic infarct with increased right-to-left subfalcine herniation, now measuring 1 cm, previously 5 mm on 11/18/2013. Asymmetric enlargement of the left lateral ventricle is slightly increased, worrisome for possible developing entrapment. 2. No evidence of hemorrhagic conversion.  11/18/2013   1. 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 There is a hyperdense right MCA concerning for thrombus.   CT Angio Head 11/18/2013  Poor visualization of the right carotid terminus concerning for embolus propagating into the proximal right M1 and right A1 segments, however there is reconstitution distally suggesting nonocclusive embolus or early re- cannulization. Mildly attenuated right M2 and M3 appearance which may be in part due to compressive changes from right cerebral edema and evolving right MCA territory infarct. Worsening right cerebral edema, increasing right to left subfalcine herniation.  Complete circle of Willis.    CT Angio Neck  11/18/2013    No hemodynamically significant stenosis.  Suspected mediastinal lymphadenopathy, partially imaged.    MRI of the brain    MRA of the brain  See angio  2D Echocardiogram  EF 35-40% with no source of embolus. Diffuse hypokinesis  Carotid Doppler  Preliminary findings: Left = 1-39% ICA stenosis. Antegrade vertebral flow. Right = very limited views due to patient immobility to turn head. Cannot definitely determine grade of stenosis. No obvious significant stenosis noted.  CXR   11/19/2013 Persistent pulmonary infiltrates question pulmonary edema, little changed. 11/18/2013    Central line tip at the brachiocephalic venous confluence. No pneumothorax is identified.   11/18/2013  The findings suggest congestive heart failure with pulmonary interstitial and early alveolar edema.     EKG  normal sinus rhythm. For complete results please see formal report.    Therapy Recommendations SNF  Physical Exam   Obese middle aged african [place] male  . Afebrile. Head is nontraumatic. Neck is supple without bruit.  l. Cardiac exam no murmur or gallop. Lungs are clear to auscultation. Distal pulses are well felt. Neurological Exam : Eyes closed. Pupils of 4 mm sluggishly reactive. Right gaze  preference but eyes can be moved reflexively to the midline. Does not blink to threat left more than right. Left lower facial weakness. Tongue midline. Patient does not speak but will follow commands on the right side consistently. Flaccid left hemiplegia with no withdrawal to painful stimuli. Purposeful antigravity movements on the right side with good tone and strength. Left plantar upgoing right is downgoing. Decreased sensation on the left. Some left-sided neglect. Gait not tested.  ASSESSMENT Todd Vargas is a 68 y.o. male presenting with right eye deviation, left arm hemiplegia and incontinence of stool and urine. Imaging confirms a right MCA infarct with cytotoxic cerebral edema, increasing overnight from 5mm ->10 mm midline shift and right to left subfalcine herniation, concern for potential ventricle entrapment. On hypertonic saline in order to decrease cerebral edema. Infarct felt to be embolic secondary to terminal ICA occlusion from new onset atrial fibrillation.  On no antithrombotics prior to admission. Now on aspirin 325 mg orally every day for secondary stroke prevention. Patient with resultant lethargy, left hemiparesis, mutism, dysphagia, able to follow 1-step commands.     Dr. Danielle DessElsner, Neuro Surgery, does not feel decompressive craniotomy is in his best interest  Induced hypernatremia with 3% saline in order to decrease cerebral edema, Na 160   Atrial Fibrillation - new onset yesterday, not an oral anticoagulation candidate at this time due to NPO status. P[laced on cardizem for rate control. CCM added Add Coreg 3.125 / Avapro 75.   CHA2DS2-VASc Score  for Atrial Fibrillation Stroke Risk = 5  (?2 oral anticoagulation recommended);  Yearly Stroke risk:  6.7%   Age in Years:  7265-74   +1    Sex:  Male   0    Congestive Heart Failure History:  No  0    Hypertension History:  yes   +1    Stroke/TIA/Thromboembolism History:  yes   +2   Vascular Disease History:  No  0    Diabetes Mellitus:  yes   +1 hypertension Hyperlipidemia, LDL 88, on no statin PTA, now on no statin as NPO, goal LDL < 70 for diabetics Diabetes, HgbA1c 6.9, goal < 7.0. Not on diabetic meds prior to admission.  Obesity, Body mass index is 33.7 kg/(m^2). Hypokalemia, replaced Ejection fraction 35-40% by echo.  Family has opted for no intubation - limited DNR   Hospital day # 8  TREATMENT/PLAN  Continue  aspirin 325 mg orally every day for secondary stroke prevention.  Increase free water, 200ml q 4 to help lower sodium. Continue to follow daily  Check renal labs in am  Follow glucose  Family agrees to PEG tube, will consult surgery  Anticipate SNF placement at time of discharge  Annie MainSHARON BIBY, MSN, RN, ANVP-BC, ANP-BC, GNP-BC [facility] Pager: [phone] 11/26/2013 9:37 AM  I have personally obtained a history, examined the patient, evaluated imaging results, and formulated the assessment and plan of care. I agree with the above. Delia HeadyPramod Rekita Miotke, MD

## 2013-11-27 DIAGNOSIS — M25561 Pain in right knee: Secondary | ICD-10-CM | POA: Diagnosis not present

## 2013-11-27 DIAGNOSIS — M25569 Pain in unspecified knee: Secondary | ICD-10-CM | POA: Diagnosis not present

## 2013-11-27 LAB — GLUCOSE, CAPILLARY
GLUCOSE-CAPILLARY: 138 mg/dL — AB (ref 70–99)
GLUCOSE-CAPILLARY: 160 mg/dL — AB (ref 70–99)
GLUCOSE-CAPILLARY: 191 mg/dL — AB (ref 70–99)
GLUCOSE-CAPILLARY: 192 mg/dL — AB (ref 70–99)
Glucose-Capillary: 99 mg/dL (ref 70–99)
Glucose-Capillary: 162 mg/dL — ABNORMAL HIGH (ref 70–99)
Glucose-Capillary: 152 mg/dL — ABNORMAL HIGH (ref 70–99)
Glucose-Capillary: 183 mg/dL — ABNORMAL HIGH (ref 70–99)

## 2013-11-27 LAB — BASIC METABOLIC PANEL
BUN: 43 mg/dL — ABNORMAL HIGH (ref 6–23)
CHLORIDE: 122 meq/L — AB (ref 96–112)
CO2: 27 mEq/L (ref 19–32)
Calcium: 9 mg/dL (ref 8.4–10.5)
Creatinine, Ser: 0.89 mg/dL (ref 0.50–1.35)
GFR calc Af Amer: >90 mL/min (ref >90)
GFR calc non Af Amer: 86 mL/min — ABNORMAL LOW (ref >90)
GLUCOSE: 179 mg/dL — AB (ref 70–99)
POTASSIUM: 4.1 meq/L (ref 3.7–5.3)
SODIUM: 162 meq/L — AB (ref 137–147)

## 2013-11-27 MED ORDER — ACETAMINOPHEN 325 MG PO TABS
650.0000 mg | ORAL_TABLET | Freq: Four times a day (QID) | ORAL | Status: DC | PRN
  Administered 2013-11-29: 650 mg via ORAL
  Filled 2013-11-27: qty 2

## 2013-11-27 MED ORDER — CEFOTETAN DISODIUM 1 G IJ SOLR
1.0000 g | Freq: Once | INTRAMUSCULAR | Status: AC
  Administered 2013-11-28: 1 g via INTRAVENOUS
  Filled 2013-11-27: qty 1

## 2013-11-27 NOTE — Progress Notes (Signed)
Speech Language Pathology Treatment: Dysphagia  Patient Details Name: Todd Vargas MRN: [id number] DOB: [birth year] Today's Date: 11/27/2013 Time: 3382-5053 SLP Time Calculation (min): 41 min  Assessment / Plan / Recommendation Clinical Impression  Pt. Sitting up in chair, more alert today.  Still exhibits significant left facial droop, with poor lip and tongue control, resulting in severe dysarthria with poor speech intelligibility.  Initially pt. Was unable to maintain adequate lip closure with ice chip falling anteriorly from oral cavity.  With max verbal and tactile cues, pt. Eventually was able to move ice/water back and had a slow, sluggish, delayed swallow.  No cough, but a delayed throat clearing was noted.  Pt. Took a bite of applesauce with similar results.  Multiple swallows were noted with one bite, indicative of laryngeal pooling.  Patient remains at significant risk for aspiration of po's secondary to moderate to severe oral motor-sensory impairment, as well as a suspected delayed swallow initiation and suspected decreased laryngeal elevation and hyoid excursion, and likely severe laryngeal residue.  Patient will need an objective swallow evaluation prior to initiation of po's, but does not appear to be ready for MBS or FEES just yet.   HPI HPI: 68 yo admitted on 1/26 w/ history of DM, HTN.  CT revealed continued interval evolution of large right MCA territory ischemic infarct with increased right-to-left subfalcine herniation, now measuring 1 cm, previously 5 mm on 11/18/2013. Deficits included: severe dysarthria and left sided neglect. PCCM asked to assess for CVL placement in effort to provide hypertonic saline and out of concern for inability to protect airway / impending respiratory failure.  Pt. did not require intubation.   Pertinent Vitals Afebrile; CXR: atx. Vs. Pneumonia in LLL  SLP Plan  Continue with current plan of care    Recommendations Diet recommendations: NPO Medication  Administration: Via alternative means              Oral Care Recommendations: Oral care Q4 per protocol Follow up Recommendations: Skilled Nursing facility Plan: Continue with current plan of care    GO     Maryjo Rochester T 11/27/2013, 3:17 PM

## 2013-11-27 NOTE — Progress Notes (Signed)
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 Pt continues to have decreased response or movement on Lt UE/LE. Pt perseverating today on being able to drink water. Pt continues to attempt activities with Rt UE and LE. Cont to follow per POC.   Follow Up Recommendations  SNF;Supervision/Assistance - 24 hour     Does the patient have the potential to tolerate intense rehabilitation     Barriers to Discharge        Equipment Recommendations  None recommended by PT    Recommendations for Other Services    Frequency Min 3X/week   Progress towards PT Goals Progress towards PT goals: Progressing toward goals  Plan Current plan remains appropriate    Precautions / Restrictions Precautions Precautions: Fall Precaution Comments: Lt side neglect and flaccid  Restrictions Weight Bearing Restrictions: No   Pertinent Vitals/Pain Stable t/o session.     Mobility  Bed Mobility Overal bed mobility: + 2 for safety/equipment;+2 for physical assistance;Needs Assistance Bed Mobility: Rolling;Supine to Sit;Sit to Supine Rolling: +2 for physical assistance;Total assist Supine to sit: +2 for physical assistance;Total assist Sit to supine: HOB elevated;+2 for physical assistance;+2 for safety/equipment;Total assist General bed mobility comments: Pt able to use Rt UE to pull on handrail and attempt transfer; pt unable to advance Lt UE and LE; requires 2 person for mobility  Transfers Overall transfer level: Needs assistance General transfer comment: pt unable to achieve standing safely; requries maxi move lift for safety to get OOB and to the chair today  Modified Rankin (Stroke Patients  Only) Pre-Morbid Rankin Score: No symptoms Modified Rankin: Severe disability    Exercises Other Exercises Other Exercises: attempted to have pt increase WB on Lt UE; pt unable to weightshift at all and requires max (A) to maintain balance Other Exercises: performed x5 LAQ with max multimodal cues   PT Diagnosis:    PT Problem List:   PT Treatment Interventions:     PT Goals (current goals can now be found in the care plan section) Acute Rehab PT Goals Patient Stated Goal: " i want water" PT Goal Formulation: Patient unable to participate in goal setting Time For Goal Achievement: 12/04/13 Potential to Achieve Goals: Fair  Visit Information  Last PT Received On: 11/27/13 Assistance Needed: +2 History of Present Illness: 68 yo with 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    Subjective Data  Subjective: pt lying supine; answers questions inconsistently; continues to neglect Lt Side; perseverated on asking for water throughout session Patient Stated Goal: " i want water"   Cognition  Cognition Arousal/Alertness: Awake/alert Behavior During Therapy: Flat affect Overall Cognitive Status: Impaired/Different from baseline Area of Impairment: Following commands;Problem solving;Attention Current Attention Level: Focused Following Commands: Follows one step commands inconsistently;Follows one step commands with increased time Problem Solving: Slow processing;Decreased initiation;Difficulty sequencing;Requires verbal cues;Requires tactile cues General Comments: L sided neglect, able to complete functional tasks with R UE     Balance  Balance Overall balance assessment: Needs assistance Sitting-balance support: Feet supported;Single extremity supported Sitting balance-Leahy Scale: Zero Sitting balance - Comments: Pt was able to hold onto handrail with Rt UE but is unable to hold himself up in sitting position;  requires max (A) to sit EOB; tolerated ~4 min; incr trunk flexion  noticed Postural control: Posterior lean;Left lateral lean General Comments General comments (skin integrity, edema, etc.): pt continues to neglect Lt side; decreased cervical rotation noted today   End of Session PT - End of Session Equipment Utilized During Treatment: Other (comment) (maxi move) Activity Tolerance: Patient limited by fatigue Patient left: in chair;with call bell/phone within reach Nurse Communication: Mobility status;Need for lift equipment   GP     Donell Sievert,  [place] [phone] 11/27/2013, 5:51 PM

## 2013-11-27 NOTE — Progress Notes (Signed)
Patient arrived to room from [place]. Report received from Nedra Hai, [place]. PAtient denied any pain at this time, appears in no distress, asking for something to drink. Safety precaution reviewed with patient. Call light within reach. Bed alarm activated. No other distress noted. Will continue to monitor.  Sim Boast, RN

## 2013-11-27 NOTE — Progress Notes (Signed)
Stroke Team Progress Note  HISTORY Todd Vargas is an 68 y.o. male --much of history obtained from chart--patient was LSN at 8 pm last night 11/17/2013. HE was found by a friend this morning 11/18/2013 with right eye gaze deviation, left arm hemiplegia and incontinent of stool and urine. CT head obtained on arrival shows a dense right MCA sign and evolving right MCA infarct. Currently patient is in ED, he is able to follow simple commands with his right side, able to say minimal words such as two, five yes but very dysarthric. Patient was not administerd TPA secondary to delay in arrival. Due to risk neurological worsening and impending brain herniation, he was admitted to the neuro ICU for further evaluation and treatment.  SUBJECTIVE Patient complains of right knee pain, tender.  OBJECTIVE Most recent Vital Signs: Filed Vitals:   11/26/13 2000 11/27/13 0000 11/27/13 0400 11/27/13 0734  BP:      Pulse:      Temp: 98.1 F (36.7 C) 98.3 F (36.8 C) 98.2 F (36.8 C) 97.9 F (36.6 C)  TempSrc: Oral Oral Oral Axillary  Resp:      Height:      Weight:   125.3 kg (276 lb 3.8 oz)   SpO2:       CBG (last 3)   Recent Labs  11/26/13 2343 11/27/13 0157 11/27/13 0319  GLUCAP 160* 162* 152*    IV Fluid Intake:   . feeding supplement (OSMOLITE 1.5 CAL) 1,000 mL (11/27/13 0554)    MEDICATIONS  . antiseptic oral rinse  15 mL Mouth Rinse q12n4p  . aspirin  325 mg Oral Daily  . carvedilol  3.125 mg Oral BID WC  . chlorhexidine  15 mL Mouth Rinse BID  . diltiazem  90 mg Oral Q6H  . enoxaparin (LOVENOX) injection  40 mg Subcutaneous Daily  . feeding supplement (PRO-STAT SUGAR FREE 64)  30 mL Per Tube 5 X Daily  . free water  200 mL Per Tube Q4H  . insulin aspart  0-15 Units Subcutaneous Q4H  . irbesartan  75 mg Oral Daily  . sodium chloride  10-40 mL Intracatheter Q12H   PRN:  ondansetron (ZOFRAN) IV, sodium chloride  Diet:  NPO  Activity:  Up with assistance DVT Prophylaxis:   Lovenox  CLINICALLY SIGNIFICANT STUDIES Basic Metabolic Panel:  Recent Labs Lab 11/21/13 2220 11/22/13 0100  11/23/13 0320  11/26/13 0450 11/27/13 0530  NA 157* 158*  < > 156*  < > 160* 162*  K  --  3.8  --  3.7  --   --  4.1  CL  --  117*  --  117*  --   --  122*  CO2  --  25  --  25  --   --  27  GLUCOSE  --  154*  --  211*  --   --  179*  BUN  --  25*  --  35*  --   --  43*  CREATININE  --  0.86  --  0.89  --   --  0.89  CALCIUM  --  9.2  --  9.3  --   --  9.0  MG  --  2.5  --   --   --   --   --   < > = values in this interval not displayed. Liver Function Tests:  No results found for this basename: AST, ALT, ALKPHOS, BILITOT, PROT, ALBUMIN,  in the last 168 hours  CBC:   Recent Labs Lab 11/21/13 0315 11/23/13 0320  WBC 10.2 10.7*  HGB 13.7 14.3  HCT 43.2 45.3  MCV 81.4 81.6  PLT 156 134*   Coagulation:  No results found for this basename: LABPROT, INR,  in the last 168 hours Cardiac Enzymes:  No results found for this basename: CKTOTAL, CKMB, CKMBINDEX, TROPONINI,  in the last 168 hours Urinalysis:  No results found for this basename: COLORURINE, APPERANCEUR, LABSPEC, PHURINE, GLUCOSEU, HGBUR, BILIRUBINUR, KETONESUR, PROTEINUR, UROBILINOGEN, NITRITE, LEUKOCYTESUR,  in the last 168 hours Lipid Panel    Component Value Date/Time   CHOL 148 11/19/2013 0316   TRIG 104 11/19/2013 0316   HDL 39* 11/19/2013 0316   CHOLHDL 3.8 11/19/2013 0316   VLDL 21 11/19/2013 0316   LDLCALC 88 11/19/2013 0316   HgbA1C  Lab Results  Component Value Date   HGBA1C 6.9* 11/19/2013    Urine Drug Screen:   No results found for this basename: labopia,  cocainscrnur,  labbenz,  amphetmu,  thcu,  labbarb    Alcohol Level:  No results found for this basename: ETH,  in the last 168 hours  CT of the brain   11/23/2013- right middle cerebral artery territory infarct with overall  decreased mass effect, no hemorrhagic conversion 11/20/2013 . Continued interval evolution of large right MCA  territory ischemic infarct with increased right-to-left subfalcine herniation, now measuring 1 cm, previously 5 mm on 11/18/2013. Asymmetric enlargement of the left lateral ventricle is slightly increased, worrisome for possible developing entrapment. 2. No evidence of hemorrhagic conversion.  11/18/2013   1. 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 There is a hyperdense right MCA concerning for thrombus.   CT Angio Head 11/18/2013  Poor visualization of the right carotid terminus concerning for embolus propagating into the proximal right M1 and right A1 segments, however there is reconstitution distally suggesting nonocclusive embolus or early re- cannulization. Mildly attenuated right M2 and M3 appearance which may be in part due to compressive changes from right cerebral edema and evolving right MCA territory infarct. Worsening right cerebral edema, increasing right to left subfalcine herniation.  Complete circle of Willis.    CT Angio Neck  11/18/2013    No hemodynamically significant stenosis.  Suspected mediastinal lymphadenopathy, partially imaged.    MRI of the brain    MRA of the brain  See angio  2D Echocardiogram  EF 35-40% with no source of embolus. Diffuse hypokinesis  Carotid Doppler  Preliminary findings: Left = 1-39% ICA stenosis. Antegrade vertebral flow. Right = very limited views due to patient immobility to turn head. Cannot definitely determine grade of stenosis. No obvious significant stenosis noted.  CXR   11/19/2013 Persistent pulmonary infiltrates question pulmonary edema, little changed. 11/18/2013    Central line tip at the brachiocephalic venous confluence. No pneumothorax is identified.   11/18/2013  The findings suggest congestive heart failure with pulmonary interstitial and early alveolar edema.     EKG  normal sinus rhythm. For complete results please see formal report.   Therapy Recommendations SNF  Physical Exam   Obese middle aged  african [place] male  . Afebrile. Head is nontraumatic. Neck is supple without bruit.  l. Cardiac exam no murmur or gallop. Lungs are clear to auscultation. Distal pulses are well felt. Neurological Exam : Eyes closed. Pupils of 4 mm sluggishly reactive. Right gaze preference but eyes can be moved reflexively to the midline. Does not blink to threat left more than right. Left lower facial weakness.  Tongue midline. Patient does not speak but will follow commands on the right side consistently. Flaccid left hemiplegia with no withdrawal to painful stimuli. Purposeful antigravity movements on the right side with good tone and strength. Left plantar upgoing right is downgoing. Decreased sensation on the left. Some left-sided neglect. Gait not tested.  ASSESSMENT Todd Vargas is a 68 y.o. male presenting with right eye deviation, left arm hemiplegia and incontinence of stool and urine. Imaging confirms a right MCA infarct with cytotoxic cerebral edema, increasing overnight from 85mm ->10 mm midline shift and right to left subfalcine herniation, concern for potential ventricle entrapment. On hypertonic saline in order to decrease cerebral edema. Infarct felt to be embolic secondary to terminal ICA occlusion from new onset atrial fibrillation.  On no antithrombotics prior to admission. Now on aspirin 325 mg orally every day for secondary stroke prevention. Patient with resultant lethargy, left hemiparesis, mutism, dysphagia, able to follow 1-step commands.     Dr. Danielle Dess, Neuro Surgery, does not feel decompressive craniotomy is in his best interest  Induced hypernatremia with 3% saline in order to decrease cerebral edema, drip has been off since, sodium rising, even in the setting of increased water, Na 162. BUN elevated, creatinine stable.   Atrial Fibrillation - new onset yesterday, not an oral anticoagulation candidate at this time due to NPO status. Placed on cardizem for rate control. CCM added Add  Coreg 3.125 / Avapro 75.   CHA2DS2-VASc Score for Atrial Fibrillation Stroke Risk = 5  (?2 oral anticoagulation recommended);  Yearly Stroke risk:  6.7%   Age in Years:  79-74   +1    Sex:  Male   0    Congestive Heart Failure History:  No  0    Hypertension History:  yes   +1    Stroke/TIA/Thromboembolism History:  yes   +2   Vascular Disease History:  No  0    Diabetes Mellitus:  yes   +1 hypertension Hyperlipidemia, LDL 88, on no statin PTA, now on no statin as NPO, goal LDL < 70 for diabetics Diabetes, HgbA1c 6.9, goal < 7.0. Not on diabetic meds prior to admission. 120-160s Obesity, Body mass index is 34.53 kg/(m^2). Hypokalemia, replaced Ejection fraction 35-40% by echo.  Family has opted for no intubation - limited DNR   Hospital day # 9  TREATMENT/PLAN  Transfer to the floor  Continue  aspirin 325 mg orally every day for secondary stroke prevention.  Follow glucose  Follow NA, BMET am  PEG placement Thursday by Surgery  Anticipate SNF placement at time of discharge  Annie Main, MSN, RN, ANVP-BC, ANP-BC, [facility] Pager: [phone] 11/27/2013 8:04 AM  I have personally obtained a history, examined the patient, evaluated imaging results, and formulated the assessment and plan of care. I agree with the above.  Delia Heady, MD

## 2013-11-28 ENCOUNTER — Encounter (HOSPITAL_COMMUNITY)
Admission: EM | Disposition: A | Discharge status: Skilled Nursing Facility | Payer: Self-pay | Source: Home / Self Care | Attending: Neurology

## 2013-11-28 ENCOUNTER — Encounter (HOSPITAL_COMMUNITY): Payer: Self-pay | Admitting: *Deleted

## 2013-11-28 HISTORY — PX: PEG PLACEMENT: SHX5437

## 2013-11-28 HISTORY — PX: ESOPHAGOGASTRODUODENOSCOPY: SHX5428

## 2013-11-28 LAB — GLUCOSE, CAPILLARY
GLUCOSE-CAPILLARY: 177 mg/dL — AB (ref 70–99)
GLUCOSE-CAPILLARY: 138 mg/dL — AB (ref 70–99)
Glucose-Capillary: 131 mg/dL — ABNORMAL HIGH (ref 70–99)
Glucose-Capillary: 134 mg/dL — ABNORMAL HIGH (ref 70–99)
Glucose-Capillary: 137 mg/dL — ABNORMAL HIGH (ref 70–99)
Glucose-Capillary: 154 mg/dL — ABNORMAL HIGH (ref 70–99)

## 2013-11-28 LAB — BASIC METABOLIC PANEL
BUN: 31 mg/dL — ABNORMAL HIGH (ref 6–23)
CO2: 28 meq/L (ref 19–32)
Calcium: 9 mg/dL (ref 8.4–10.5)
Chloride: 120 mEq/L — ABNORMAL HIGH (ref 96–112)
Creatinine, Ser: 0.79 mg/dL (ref 0.50–1.35)
GFR calc Af Amer: >90 mL/min (ref >90)
GFR calc non Af Amer: >90 mL/min (ref >90)
GLUCOSE: 167 mg/dL — AB (ref 70–99)
Potassium: 4.1 mEq/L (ref 3.7–5.3)
SODIUM: 158 meq/L — AB (ref 137–147)

## 2013-11-28 SURGERY — EGD (ESOPHAGOGASTRODUODENOSCOPY)
Anesthesia: Moderate Sedation

## 2013-11-28 MED ORDER — MIDAZOLAM HCL 5 MG/ML IJ SOLN
INTRAMUSCULAR | Status: AC
  Filled 2013-11-28: qty 2

## 2013-11-28 MED ORDER — FENTANYL CITRATE 0.05 MG/ML IJ SOLN
INTRAMUSCULAR | Status: DC | PRN
  Administered 2013-11-28 (×2): 25 ug via INTRAVENOUS

## 2013-11-28 MED ORDER — FENTANYL CITRATE 0.05 MG/ML IJ SOLN
INTRAMUSCULAR | Status: AC
  Filled 2013-11-28: qty 2

## 2013-11-28 MED ORDER — DIPHENHYDRAMINE HCL 50 MG/ML IJ SOLN
INTRAMUSCULAR | Status: AC
  Filled 2013-11-28: qty 1

## 2013-11-28 MED ORDER — MIDAZOLAM HCL 10 MG/2ML IJ SOLN
INTRAMUSCULAR | Status: DC | PRN
  Administered 2013-11-28 (×2): 1 mg via INTRAVENOUS
  Administered 2013-11-28: 2 mg via INTRAVENOUS
  Administered 2013-11-28: 1 mg via INTRAVENOUS

## 2013-11-28 MED ORDER — BUTAMBEN-TETRACAINE-BENZOCAINE 2-2-14 % EX AERO
INHALATION_SPRAY | CUTANEOUS | Status: DC | PRN
  Administered 2013-11-28: 2 via TOPICAL

## 2013-11-28 NOTE — Progress Notes (Signed)
Pt back to the unit in his room. PEG site clean, dry and intact. Pt Alert and responsive. Pt vitals taken, remains calm in bed and will continue to monitor quietly.

## 2013-11-28 NOTE — Progress Notes (Signed)
Day of Surgery  Subjective: No new complaint  Objective: Vital signs in last 24 hours: Temp:  [97.4 F (36.3 C)-98.8 F (37.1 C)] 97.6 F (36.4 C) (02/05 1002) Pulse Rate:  [50-70] 50 (02/05 0900) Resp:  [11-20] 12 (02/05 1002) BP: (117-149)/(62-75) 149/75 mmHg (02/05 1002) SpO2:  [97 %-100 %] 100 % (02/05 1002) Weight:  [275 lb 9.2 oz (125 kg)-276 lb 0.3 oz (125.2 kg)] 275 lb 9.2 oz (125 kg) (02/05 0518) Last BM Date: 11/27/13  Intake/Output from previous day: 02/04 0701 - 02/05 0700 In: 2093.3 [I.V.:40; NG/GT:2053.3] Out: 1075 [Urine:1075] Intake/Output this shift: Total I/O In: 40 [I.V.:40] Out: -   General appearance: no distress Resp: clear to auscultation bilaterally Cardio: regular rate and rhythm GI: soft, NT Neuro: some speech, L hemipar  Lab Results:  No results found for this basename: WBC, HGB, HCT, PLT,  in the last 72 hours BMET  Recent Labs  11/27/13 0530 11/28/13 0850  NA 162* 158*  K 4.1 4.1  CL 122* 120*  CO2 27 28  GLUCOSE 179* 167*  BUN 43* 31*  CREATININE 0.89 0.79  CALCIUM 9.0 9.0   PT/INR No results found for this basename: LABPROT, INR,  in the last 72 hours ABG No results found for this basename: PHART, PCO2, PO2, HCO3,  in the last 72 hours  Studies/Results: No results found.  Anti-infectives: Anti-infectives   Start     Dose/Rate Route Frequency Ordered Stop   11/28/13 1000  cefoTEtan (CEFOTAN) 1 g in dextrose 5 % 50 mL IVPB     1 g 100 mL/hr over 30 Minutes Intravenous  Once 11/27/13 2305        Assessment/Plan: For PEG today  Todd Vargas E 11/28/2013

## 2013-11-28 NOTE — Op Note (Signed)
11/18/2013 - 11/28/2013  10:37 AM  PATIENT:  Todd Vargas  68 y.o. male  PRE-OPERATIVE DIAGNOSIS:  Dysphagia S/P CVA  POST-OPERATIVE DIAGNOSIS:  Dysphagia S/P CVA  PROCEDURE:  Procedure(s): ESOPHAGOGASTRODUODENOSCOPY (EGD) PERCUTANEOUS ENDOSCOPIC GASTROSTOMY (PEG) PLACEMENT  SURGEON:  Surgeon(s): Liz Malady, MD  PHYSICIAN ASSISTANT:   ASSISTANTS: Charma Igo, [facility]   ANESTHESIA:   local and IV sedation  EBL:  Total I/O In: 40 [I.V.:40] Out: -   BLOOD ADMINISTERED:none  DRAINS: none   SPECIMEN:  No Specimen  DISPOSITION OF SPECIMEN:  N/A  COUNTS:  YES  DICTATION: .Dragon Dictation  Patient has dysphagia status post cerebrovascular accident. He presents for EGD and PEG placement. Informed consent was obtained at a time from his family. He was identified in the endoscopy suite.He received IV antibiotics. We did time out procedure. His was on HD monitoring.He received IV sedation. Cetacaine throat spray was applied. The esophagogastroduodenoscope was inserted via his mouth and into his esophagus. There were no gross abnormalities. There were some mucous present. Scope was advanced into the stomach which was insufflated. The scope was then passed into the first and second portions of the duodenum. There were no ulcers seen. No obstructions or other abnormality was seen. The scope was withdrawn back into the stomach which was further insufflated. An excellent poke site was obtained. Abdomen was prepped and draped in a sterile fashion. Local anesthetic was placed at the poke site. Small incision was made there and Angiocath was inserted under direct vision. Guidewire was passed and grasped inside the stomach with the endoscopic snare. Guidewire was brought out through the mouth and the PEG tube was attached and lubricated. PEG tube was brought back out through the abdominal wall. Scope was reinserted down the esophagus into the stomach. Location of the PEG tube was verified. The  flange was applied to the PEG tube and it was tightened until it just rotated. Photo was taken and scope was withdrawn. Stomach was evacuated. Anabolic ointment and dressing were placed at the PEG site. Patient tolerated the procedure well without apparent complication. He was transferred to the endo recovery area in stable condition. Abdominal binder was ordered.  PATIENT DISPOSITION:  PACU - hemodynamically stable.   Delay start of Pharmacological VTE agent (>24hrs) due to surgical blood loss or risk of bleeding:  no  Violeta Gelinas, MD, MPH, FACS Pager: [phone]  2/5/201510:37 AM

## 2013-11-28 NOTE — Progress Notes (Signed)
Pt picked up to be transported to Endoscopy for PEG placement. Pt niece Misty Stanley had called earlier requesting for MD to call her prior to pt leaving for procedure. MD left a note. 09:45 Misty Stanley was called and left a message about pt about to be picked up for the procedure and NP Jasmine December notified at [phone]. Misty Stanley returned call back at [phone] and notified of pt being picked up to the procedure. Misty Stanley OK and pt sent down via bed.

## 2013-11-28 NOTE — Progress Notes (Signed)
SLP Cancellation Note  Patient Details Name: Jacori Nagarajan MRN: [id number] DOB: 1946/07/02   Cancelled treatment:       Reason Eval/Treat Not Completed: Patient at procedure or test/unavailable (scheduled for PEG tube placement today. Will f/u 2/6. )  Ferdinand Lango MA, CCC-SLP [phone]  Shaka Zech [name] 11/28/2013, 11:18 AM

## 2013-11-28 NOTE — Progress Notes (Signed)
RN called Dr. Janee Morn to clarify pt PEG use and he states "medication is fine and will start feedings tomorrow". Can use peg for medication at this time.

## 2013-11-28 NOTE — Progress Notes (Signed)
Tube feeding stopped for procedure in the AM.

## 2013-11-28 NOTE — Progress Notes (Signed)
Pt NG tube noted to be pulled almost out or his Left nare. NP Jasmine December notified and Ok to removed tube since pt will be going for PEG tube placement later on today. Tube removed from pt nare. Will continue to monitor.

## 2013-11-28 NOTE — Progress Notes (Signed)
Stroke Team Progress Note  HISTORY Todd Vargas is an 68 y.o. male --much of history obtained from chart--patient was LSN at 8 pm last night 11/17/2013. HE was found by a friend this morning 11/18/2013 with right eye gaze deviation, left arm hemiplegia and incontinent of stool and urine. CT head obtained on arrival shows a dense right MCA sign and evolving right MCA infarct. Currently patient is in ED, he is able to follow simple commands with his right side, able to say minimal words such as two, five yes but very dysarthric. Patient was not administerd TPA secondary to delay in arrival. Due to risk neurological worsening and impending brain herniation, he was admitted to the neuro ICU for further evaluation and treatment.  SUBJECTIVE Patient just out of PEG procedure. RN at bedside in Endo recovery.  OBJECTIVE Most recent Vital Signs: Filed Vitals:   11/28/13 1045 11/28/13 1050 11/28/13 1100 11/28/13 1105  BP: 132/70 138/74  130/70  Pulse: 62 62 62 61  Temp:   98 F (36.7 C)   TempSrc:   Oral   Resp: 20 19 19 18   Height:      Weight:      SpO2: 100% 100% 97% 99%   CBG (last 3)   Recent Labs  11/28/13 0117 11/28/13 0507 11/28/13 1009  GLUCAP 177* 131* 134*   IV Fluid Intake:     MEDICATIONS  . antiseptic oral rinse  15 mL Mouth Rinse q12n4p  . aspirin  325 mg Oral Daily  . carvedilol  3.125 mg Oral BID WC  . cefoTEtan (CEFOTAN) IV  1 g Intravenous Once  . chlorhexidine  15 mL Mouth Rinse BID  . diltiazem  90 mg Oral Q6H  . enoxaparin (LOVENOX) injection  40 mg Subcutaneous Daily  . insulin aspart  0-15 Units Subcutaneous Q4H  . irbesartan  75 mg Oral Daily  . sodium chloride  10-40 mL Intracatheter Q12H   PRN:  acetaminophen, ondansetron (ZOFRAN) IV, sodium chloride  Diet:  NPO  Activity:  Up with assistance DVT Prophylaxis:  Lovenox  CLINICALLY SIGNIFICANT STUDIES Basic Metabolic Panel:  Recent Labs Lab 11/21/13 2220 11/22/13 0100  11/27/13 0530 11/28/13 0850   NA 157* 158*  < > 162* 158*  K  --  3.8  < > 4.1 4.1  CL  --  117*  < > 122* 120*  CO2  --  25  < > 27 28  GLUCOSE  --  154*  < > 179* 167*  BUN  --  25*  < > 43* 31*  CREATININE  --  0.86  < > 0.89 0.79  CALCIUM  --  9.2  < > 9.0 9.0  MG  --  2.5  --   --   --   < > = values in this interval not displayed. Liver Function Tests:  No results found for this basename: AST, ALT, ALKPHOS, BILITOT, PROT, ALBUMIN,  in the last 168 hours CBC:   Recent Labs Lab 11/23/13 0320  WBC 10.7*  HGB 14.3  HCT 45.3  MCV 81.6  PLT 134*   Coagulation:  No results found for this basename: LABPROT, INR,  in the last 168 hours Cardiac Enzymes:  No results found for this basename: CKTOTAL, CKMB, CKMBINDEX, TROPONINI,  in the last 168 hours Urinalysis:  No results found for this basename: COLORURINE, APPERANCEUR, LABSPEC, PHURINE, GLUCOSEU, HGBUR, BILIRUBINUR, KETONESUR, PROTEINUR, UROBILINOGEN, NITRITE, LEUKOCYTESUR,  in the last 168 hours Lipid Panel  Component Value Date/Time   CHOL 148 11/19/2013 0316   TRIG 104 11/19/2013 0316   HDL 39* 11/19/2013 0316   CHOLHDL 3.8 11/19/2013 0316   VLDL 21 11/19/2013 0316   LDLCALC 88 11/19/2013 0316   HgbA1C  Lab Results  Component Value Date   HGBA1C 6.9* 11/19/2013    Urine Drug Screen:   No results found for this basename: labopia,  cocainscrnur,  labbenz,  amphetmu,  thcu,  labbarb    Alcohol Level:  No results found for this basename: ETH,  in the last 168 hours  CT of the brain   11/23/2013- right middle cerebral artery territory infarct with overall  decreased mass effect, no hemorrhagic conversion 11/20/2013 . Continued interval evolution of large right MCA territory ischemic infarct with increased right-to-left subfalcine herniation, now measuring 1 cm, previously 5 mm on 11/18/2013. Asymmetric enlargement of the left lateral ventricle is slightly increased, worrisome for possible developing entrapment. 2. No evidence of hemorrhagic  conversion.  11/18/2013   1. 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 There is a hyperdense right MCA concerning for thrombus.   CT Angio Head 11/18/2013  Poor visualization of the right carotid terminus concerning for embolus propagating into the proximal right M1 and right A1 segments, however there is reconstitution distally suggesting nonocclusive embolus or early re- cannulization. Mildly attenuated right M2 and M3 appearance which may be in part due to compressive changes from right cerebral edema and evolving right MCA territory infarct. Worsening right cerebral edema, increasing right to left subfalcine herniation.  Complete circle of Willis.    CT Angio Neck  11/18/2013    No hemodynamically significant stenosis.  Suspected mediastinal lymphadenopathy, partially imaged.    MRI of the brain    MRA of the brain  See angio  2D Echocardiogram  EF 35-40% with no source of embolus. Diffuse hypokinesis  Carotid Doppler  Preliminary findings: Left = 1-39% ICA stenosis. Antegrade vertebral flow. Right = very limited views due to patient immobility to turn head. Cannot definitely determine grade of stenosis. No obvious significant stenosis noted.  CXR   11/19/2013 Persistent pulmonary infiltrates question pulmonary edema, little changed. 11/18/2013    Central line tip at the brachiocephalic venous confluence. No pneumothorax is identified.   11/18/2013  The findings suggest congestive heart failure with pulmonary interstitial and early alveolar edema.     EKG  normal sinus rhythm. For complete results please see formal report.   Therapy Recommendations SNF  Physical Exam   Obese middle aged african [place] male  . Afebrile. Head is nontraumatic. Neck is supple without bruit.  l. Cardiac exam no murmur or gallop. Lungs are clear to auscultation. Distal pulses are well felt. Neurological Exam : Eyes closed. Pupils of 4 mm sluggishly reactive. Right gaze preference but eyes  can be moved reflexively to the midline. Does not blink to threat left more than right. Left lower facial weakness. Tongue midline. Patient does not speak but will follow commands on the right side consistently. Flaccid left hemiplegia with no withdrawal to painful stimuli. Purposeful antigravity movements on the right side with good tone and strength. Left plantar upgoing right is downgoing. Decreased sensation on the left. Some left-sided neglect. Gait not tested.  ASSESSMENT [name] is a 68 y.o. male presenting with right eye deviation, left arm hemiplegia and incontinence of stool and urine. Imaging confirms a right MCA infarct with cytotoxic cerebral edema, increasing overnight from 5mm ->10 mm midline shift and right to  left subfalcine herniation, concern for potential ventricle entrapment. On hypertonic saline in order to decrease cerebral edema. Infarct felt to be embolic secondary to terminal ICA occlusion from new onset atrial fibrillation.  On no antithrombotics prior to admission. Now on aspirin 325 mg orally every day for secondary stroke prevention. Patient with resultant lethargy, left hemiparesis, mutism, dysphagia, able to follow 1-step commands.    Dr. Danielle Dess, Neuro Surgery, does not feel decompressive craniotomy is in his best interest  Induced hypernatremia with 3% saline in order to decrease cerebral edema, drip stopped days ago, sodium finally starting to slowly decrease, Na 160. BUN decreasing, creatinine stable.  Atrial Fibrillation - new onset yesterday, not an oral anticoagulation candidate at this time due to NPO status. Placed on cardizem for rate control. CCM added Add Coreg 3.125 / Avapro 75.   CHA2DS2-VASc Score for Atrial Fibrillation Stroke Risk = 5  (?2 oral anticoagulation recommended);  Yearly Stroke risk:  6.7%   Age in Years:  107-74   +1    Sex:  Male   0    Congestive Heart Failure History:  No  0    Hypertension History:  yes   +1     Stroke/TIA/Thromboembolism History:  yes   +2   Vascular Disease History:  No  0    Diabetes Mellitus:  yes   +1 hypertension Hyperlipidemia, LDL 88, on no statin PTA, now on no statin as NPO, goal LDL < 70 for diabetics Diabetes, HgbA1c 6.9, goal < 7.0. Not on diabetic meds prior to admission. 120-160s Obesity, Body mass index is 34.44 kg/(m^2). Hypokalemia, replaced Ejection fraction 35-40% by echo. Dysphagia secondary to stroke, PEG placed 11/28/2013, can start to use tomorrow per surgical MD  Family has opted for no intubation - limited DNR   Hospital day # 10  TREATMENT/PLAN  Continue  aspirin 325 mg orally every day for secondary stroke prevention.  Follow glucose  Follow NA  Anticipate SNF placement at time of discharge  Annie Main, MSN, RN, ANVP-BC, ANP-BC, GNP-BC [facility] Pager: [phone] 11/28/2013 11:08 AM  I have personally obtained a history, examined the patient, evaluated imaging results, and formulated the assessment and plan of care. I agree with the above. Delia Heady, MD

## 2013-11-29 ENCOUNTER — Encounter (HOSPITAL_COMMUNITY): Payer: Self-pay | Admitting: General Surgery

## 2013-11-29 DIAGNOSIS — E669 Obesity, unspecified: Secondary | ICD-10-CM

## 2013-11-29 DIAGNOSIS — I10 Essential (primary) hypertension: Secondary | ICD-10-CM | POA: Diagnosis present

## 2013-11-29 DIAGNOSIS — E785 Hyperlipidemia, unspecified: Secondary | ICD-10-CM | POA: Diagnosis present

## 2013-11-29 DIAGNOSIS — E119 Type 2 diabetes mellitus without complications: Secondary | ICD-10-CM

## 2013-11-29 DIAGNOSIS — I4891 Unspecified atrial fibrillation: Secondary | ICD-10-CM | POA: Diagnosis not present

## 2013-11-29 LAB — GLUCOSE, CAPILLARY
GLUCOSE-CAPILLARY: 113 mg/dL — AB (ref 70–99)
GLUCOSE-CAPILLARY: 131 mg/dL — AB (ref 70–99)
GLUCOSE-CAPILLARY: 143 mg/dL — AB (ref 70–99)
GLUCOSE-CAPILLARY: 156 mg/dL — AB (ref 70–99)
Glucose-Capillary: 147 mg/dL — ABNORMAL HIGH (ref 70–99)
Glucose-Capillary: 158 mg/dL — ABNORMAL HIGH (ref 70–99)
Glucose-Capillary: 164 mg/dL — ABNORMAL HIGH (ref 70–99)

## 2013-11-29 LAB — SODIUM: Sodium: 154 mEq/L — ABNORMAL HIGH (ref 137–147)

## 2013-11-29 MED ORDER — OSMOLITE 1.5 CAL PO LIQD: 1000.0000 mL | ORAL | Status: AC

## 2013-11-29 MED ORDER — IRBESARTAN 75 MG PO TABS: 75.0000 mg | ORAL_TABLET | Freq: Every day | ORAL | Status: DC

## 2013-11-29 MED ORDER — DILTIAZEM 12 MG/ML ORAL SUSPENSION: 90.0000 mg | Freq: Four times a day (QID) | ORAL | Status: DC

## 2013-11-29 MED ORDER — RIVAROXABAN 20 MG PO TABS
20.0000 mg | ORAL_TABLET | Freq: Every day | ORAL | Status: DC
Start: 2013-11-29 — End: 2013-11-30
  Administered 2013-11-29: 20 mg via ORAL
  Filled 2013-11-29 (×2): qty 1

## 2013-11-29 MED ORDER — OSMOLITE 1.5 CAL PO LIQD
1000.0000 mL | ORAL | Status: DC
  Administered 2013-11-29: 1000 mL
  Filled 2013-11-29 (×3): qty 1000

## 2013-11-29 MED ORDER — CARVEDILOL 3.125 MG PO TABS: 3.1250 mg | ORAL_TABLET | Freq: Two times a day (BID) | ORAL | Status: DC

## 2013-11-29 MED ORDER — ASPIRIN 325 MG PO TABS: 325.0000 mg | ORAL_TABLET | Freq: Every day | ORAL | Status: DC

## 2013-11-29 MED ORDER — RIVAROXABAN 20 MG PO TABS: 20.0000 mg | ORAL_TABLET | Freq: Every day | ORAL | Status: DC

## 2013-11-29 NOTE — Discharge Summary (Signed)
Stroke Discharge Summary  Patient ID: Todd Vargas   MRN: [id number]      DOB: [birth year]  Date of Admission: 11/18/2013 Date of Discharge: 11/29/2013  Attending Physician:  Darcella Cheshire, MD, Stroke MD  Consulting Physician(s):     Lonia Farber, MD (pulmonary/intensive care), Barnett Abu, MD (neurosurgery), and Violeta Gelinas, MD (general surgery) Patient's PCP:  No primary provider on file.  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  Past Medical History  Diagnosis Date  . Diabetes mellitus without complication   . HTN (hypertension)   . Hyperlipidemia    Past Surgical History  Procedure Laterality Date  . Esophagogastroduodenoscopy N/A 11/28/2013    Procedure: ESOPHAGOGASTRODUODENOSCOPY (EGD);  Surgeon: Liz Malady, MD;  Location: [facility] ENDOSCOPY;  Service: Endoscopy;  Laterality: N/A;  . Peg placement N/A 11/28/2013    Procedure: PERCUTANEOUS ENDOSCOPIC GASTROSTOMY (PEG) PLACEMENT;  Surgeon: Liz Malady, MD;  Location: [facility] ENDOSCOPY;  Service: Endoscopy;  Laterality: N/A;      Medication List    STOP taking these medications       aspirin EC 81 MG tablet      TAKE these medications       carvedilol 3.125 MG tablet  Commonly known as:  COREG  Take 1 tablet (3.125 mg total) by mouth 2 (two) times daily with a meal.     diltiazem 10 mg/ml oral suspension  Commonly known as:  CARDIZEM  Take 9 mLs (90 mg total)  by mouth every 6 (six) hours.     feeding supplement (OSMOLITE 1.5 CAL) Liqd  Place 1,000 mLs into feeding tube continuous.     irbesartan 75 MG tablet  Commonly known as:  AVAPRO  Take 1 tablet (75 mg total) by mouth daily.     Rivaroxaban 20 MG Tabs tablet  Commonly known as:  XARELTO  Take 1 tablet (20 mg total) by mouth daily.        LABORATORY STUDIES CBC    Component Value Date/Time   WBC 10.7* 11/23/2013 0320   RBC 5.55 11/23/2013 0320   HGB 14.3 11/23/2013 0320   HCT 45.3 11/23/2013 0320   PLT 134* 11/23/2013 0320   MCV 81.6 11/23/2013 0320   MCH 25.8* 11/23/2013 0320   MCHC 31.6 11/23/2013 0320   RDW 16.0* 11/23/2013 0320   LYMPHSABS 1.5 11/18/2013 1139   MONOABS 0.3 11/18/2013 1139   EOSABS 0.0 11/18/2013 1139   BASOSABS 0.0 11/18/2013 1139   CMP    Component Value Date/Time   NA 154* 11/29/2013 0540   K 4.1 11/28/2013 0850   CL 120* 11/28/2013 0850   CO2 28 11/28/2013 0850   GLUCOSE 167* 11/28/2013 0850   BUN 31* 11/28/2013 0850   CREATININE 0.79 11/28/2013 0850   CALCIUM 9.0 11/28/2013 0850   PROT 7.7 11/18/2013 1139   ALBUMIN 4.0 11/18/2013 1139   AST 20 11/18/2013 1139   ALT 17 11/18/2013 1139   ALKPHOS 136* 11/18/2013 1139  BILITOT 0.4 11/18/2013 1139   GFRNONAA >90 11/28/2013 0850   GFRAA >90 11/28/2013 0850   COAGS Lab Results  Component Value Date   INR 1.01 11/18/2013   Lipid Panel    Component Value Date/Time   CHOL 148 11/19/2013 0316   TRIG 104 11/19/2013 0316   HDL 39* 11/19/2013 0316   CHOLHDL 3.8 11/19/2013 0316   VLDL 21 11/19/2013 0316   LDLCALC 88 11/19/2013 0316   HgbA1C  Lab Results  Component Value Date   HGBA1C 6.9* 11/19/2013   Urinalysis    Component Value Date/Time   COLORURINE YELLOW 11/19/2013 0208   APPEARANCEUR CLEAR 11/19/2013 0208   LABSPEC 1.026 11/19/2013 0208   PHURINE 6.0 11/19/2013 0208   GLUCOSEU NEGATIVE 11/19/2013 0208   HGBUR MODERATE* 11/19/2013 0208   BILIRUBINUR NEGATIVE 11/19/2013 0208   KETONESUR NEGATIVE 11/19/2013 0208    PROTEINUR 30* 11/19/2013 0208   UROBILINOGEN 0.2 11/19/2013 0208   NITRITE NEGATIVE 11/19/2013 0208   LEUKOCYTESUR SMALL* 11/19/2013 0208   Urine Drug Screen  No results found for this basename: labopia,  cocainscrnur,  labbenz,  amphetmu,  thcu,  labbarb    Alcohol Level    Component Value Date/Time   [facility] 11/18/2013 1144   SIGNIFICANT DIAGNOSTIC STUDIES CT of the brain  11/23/2013  right middle cerebral artery territory infarct with overall decreased mass effect, no hemorrhagic conversion  11/20/2013  Continued interval evolution of large right MCA territory ischemic infarct with increased right-to-left subfalcine herniation, now measuring 1 cm, previously 5 mm on 11/18/2013. Asymmetric enlargement of the left lateral ventricle is slightly increased, worrisome for possible developing entrapment. 2. No evidence of hemorrhagic conversion.  11/18/2013  1. 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 There is a hyperdense right MCA concerning for thrombus.  CT Angio Head 11/18/2013 Poor visualization of the right carotid terminus concerning for embolus propagating into the proximal right M1 and right A1 segments, however there is reconstitution distally suggesting nonocclusive embolus or early re- cannulization. Mildly attenuated right M2 and M3 appearance which may be in part due to compressive changes from right cerebral edema and evolving right MCA territory infarct. Worsening right cerebral edema, increasing right to left subfalcine herniation. Complete circle of Willis.  CT Angio Neck 11/18/2013 No hemodynamically significant stenosis. Suspected mediastinal lymphadenopathy, partially imaged.  2D Echocardiogram EF 35-40% with no source of embolus. Diffuse hypokinesis  Carotid Doppler Preliminary findings: Left = 1-39% ICA stenosis. Antegrade vertebral flow. Right = very limited views due to patient immobility to turn head. Cannot definitely determine grade of stenosis. No obvious  significant stenosis noted.  CXR  11/19/2013  Persistent pulmonary infiltrates question pulmonary edema, little changed.  11/18/2013  Central line tip at the brachiocephalic venous confluence. No pneumothorax is identified.  11/18/2013  The findings suggest congestive heart failure with pulmonary interstitial and early alveolar edema.  EKG normal sinus rhythm. For complete results please see formal report.     History of Present Illness   Todd Vargas is an 68 y.o. male --much of history obtained from chart--patient was LSN at 8 pm 11/17/2013. He was found by a friend the morning of 11/18/2013 with right eye gaze deviation, left arm hemiplegia and incontinent of stool and urine. CT head obtained on arrival showed a dense right MCA sign and evolving right MCA infarct. In ED, he was able to follow simple commands with his right side, able to say minimal words such as two, five, yes,  but very dysarthric. Patient  was not administerd TPA secondary to delay in arrival beyond the time frame. Due to risk neurological worsening and impending brain herniation, he was admitted to the neuro ICU for further evaluation and treatment.  Hospital Course Imaging confirms a right MCA infarct with cytotoxic cerebral edema, increasing overnight from 47mm ->10 mm with midline shift and right to left subfalcine herniation, with concern for potential ventricle entrapment. Dr. Danielle Dess, Neuro Surgery, was consulted and did not feel decompressive craniotomy is in his best interest. Family initially wanted full aggressive care, they later changed to partial DNR (no intubation, cardioversion, or chest compressions). Patient was placed on hypertonic saline in order to decrease cerebral edema. Once began to improve, drip was stopped, though sodium was slow to decrease. Free water was added a week prior to discharge, and sodium finally starting to decrease by the end of the week. His infarct was felt to be embolic secondary to terminal ICA  occlusion due to new onset atrial fibrillation.  He was on no antithrombotics prior to admission. He was placed on aspirin 325 mg every day for secondary stroke prevention until his PEG tube was placed. He was then changed to Xarelto 20 mg daily. Patient with resultant lethargy, left hemiparesis, mutism, dysphagia, able to follow 1-step commands. Physical therapy, occupational therapy and speech therapy evaluated patient. They recommended SNF placement. Family was agreeable.  Patient with vascular risk factors of:  Atrial Fibrillation - new onset during hospitalization, not initially placed on oral anticoagulation candidate due to NPO status. Placed on cardizem for rate control. added Add Coreg 3.125 / Avapro 75. Discharge on xarelto 20 mg daily for secondary stroke prevention.   CHA2DS2-VASc Score for Atrial Fibrillation Stroke Risk = 5 (?2 oral anticoagulation recommended); Yearly Stroke risk: 6.7%    Age in Years: 35-74 +1     Sex: Male 0     Congestive Heart Failure History: No 0     Hypertension History: yes +1     Stroke/TIA/Thromboembolism History: yes +2    Vascular Disease History: No 0     Diabetes Mellitus: yes +1  hypertension  Hyperlipidemia, LDL 88, on no statin PTA, now on no statin as NPO, goal LDL < 70 for diabetics  Diabetes, HgbA1c 6.9, goal < 7.0. Not on diabetic meds prior to admission. 120-160s. Received sliding scale insulin in hospital. Recommend oral diabetic medication as needed once stable on tube feedings. Obesity, Body mass index is 34.44 kg/(m^2).   Patient also with: Hypokalemia, replaced, normalized Ejection fraction 35-40% by echo Dysphagia secondary to stroke, PEG placed 11/28/2013, restarted TF 11/29/2013   Discharge Exam  Blood pressure 128/65, pulse 58, temperature 97.5 F (36.4 C), temperature source Oral, resp. rate 18, height 6\' 3"  (1.905 m), weight 125 kg (275 lb 9.2 oz), SpO2 100.00%.  Obese middle aged african [place] male . Afebrile. Head is  nontraumatic. Neck is supple without bruit. l. Cardiac exam no murmur or gallop. Lungs are clear to auscultation. Distal pulses are well felt.  Neurological Exam : Eyes closed. Pupils of 4 mm sluggishly reactive. Right gaze preference but eyes can be moved reflexively to the midline. Does not blink to threat left more than right. Left lower facial weakness. Tongue midline. Patient does not speak but will follow commands on the right side consistently. Flaccid left hemiplegia with no withdrawal to painful stimuli. Purposeful antigravity movements on the right side with good tone and strength. Left plantar upgoing right is downgoing. Decreased sensation on the left. Some left-sided neglect.  Gait not tested.   Discharge Diet   NPO , tube feedings  Discharge Plan    Disposition:  SNF, [facility]   Xarelto 20 mg daily for secondary stroke prevention.  Ensure Na continues to drop  Ongoing risk factor control by Primary Care Physician.  Follow-up SNF MD within 1 month  Follow-up with Dr. Delia Heady, Stroke Clinic in 2 months.  45 minutes were spent preparing discharge.  Signed Annie Main, AVNP, ANP-BC, [facility] Nurse Practitioner 11/29/2013, 3:32 PM  I have personally examined this patient, reviewed pertinent data and developed the plan of care. I agree with above.  Delia Heady, MD

## 2013-11-29 NOTE — Progress Notes (Signed)
Occupational Therapy Treatment Patient Details Name: Todd Vargas MRN: [id number] DOB: 11/03/1945 Today's Date: 11/29/2013 Time: [id number] OT Time Calculation (min): 26 min  OT Assessment / Plan / Recommendation  History of present illness 68 yo with 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   OT comments  Pt with heavy L side lean sitting EOB.  Also limited mobility due to pt did not have properly fitting abdominal binder. RN aware. Continue to recommend SNF.  Follow Up Recommendations  SNF;Supervision/Assistance - 24 hour    Barriers to Discharge       Equipment Recommendations  Other (comment)    Recommendations for Other Services    Frequency Min 2X/week   Progress towards OT Goals Progress towards OT goals: Progressing toward goals  Plan Discharge plan remains appropriate    Precautions / Restrictions Precautions Precautions: Fall Precaution Comments: Lt side neglect and flaccid    Pertinent Vitals/Pain See vitals    ADL  ADL Comments: Upon OT/PT arrival into room, pt restless in bed and stating he wanted a pen to write.  Pt was nonverbal the remainder of session but was able to follow one step commands >50% of time and with increased time (commands regarding right side movements).    OT Diagnosis:    OT Problem List:   OT Treatment Interventions:     OT Goals(current goals can now be found in the care plan section) Acute Rehab OT Goals OT Goal Formulation: Patient unable to participate in goal setting Time For Goal Achievement: 12/04/13 Potential to Achieve Goals: Good ADL Goals Pt Will Perform Grooming: with mod assist;sitting Pt Will Perform Upper Body Bathing: with mod assist;sitting Additional ADL Goal #1: sit EOB with mod A in prep for ADL Additional ADL Goal #2: follow 1 step commands consistently for ADL task with min distraction  Visit Information  Last OT Received On: 11/29/13 Assistance Needed: +2 Reason for Co-Treatment: Complexity of the patient's  impairments (multi-system involvement);For patient/therapist safety PT goals addressed during session: Balance;Mobility/safety with mobility OT goals addressed during session: ADL's and self-care;Strengthening/ROM History of Present Illness: 68 yo with 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    Subjective Data      Prior Functioning       Cognition  Cognition Arousal/Alertness: Awake/alert Behavior During Therapy: Restless;Flat affect (restless at start of session prior to bed mobility) Overall Cognitive Status: Impaired/Different from baseline Area of Impairment: Following commands;Problem solving;Attention Following Commands: Follows one step commands inconsistently;Follows one step commands with increased time Problem Solving: Slow processing;Decreased initiation;Difficulty sequencing;Requires verbal cues;Requires tactile cues General Comments: L sided neglect, able to complete functional tasks with R UE     Mobility  Bed Mobility Overal bed mobility: + 2 for safety/equipment;+2 for physical assistance;Needs Assistance Bed Mobility: Rolling;Supine to Sit;Sit to Supine Rolling: +2 for physical assistance;Total assist Supine to sit: +2 for physical assistance;Total assist Sit to supine: HOB elevated;+2 for physical assistance;+2 for safety/equipment;Total assist General bed mobility comments: Pt able to use Rt UE to pull on handrail and attempt transfer; pt unable to advance Lt UE and LE; A for trunk support. Attempted to get fully upright however patient continued with heavy L lean    Exercises      Balance Balance Sitting balance-Leahy Scale: Zero Sitting balance - Comments: required +2 throughout. heavy left lean. unable to grab handrail this session Postural control: Posterior lean;Left lateral lean  End of Session OT - End of Session Activity Tolerance: Patient limited by fatigue;Other (comment) (limited mobility  due to no abdominal binder) Patient left: in bed;with call  bell/phone within reach;with bed alarm set;with restraints reapplied Nurse Communication: Mobility status;Need for lift equipment  GO    11/29/2013 Cipriano Mile OTR/L Pager [phone] Office [phone]  Cipriano Mile 11/29/2013, 3:17 PM

## 2013-11-29 NOTE — Progress Notes (Signed)
Stroke Team Progress Note  HISTORY Todd Vargas is an 68 y.o. male --much of history obtained from chart--patient was LSN at 8 pm last night 11/17/2013. HE was found by a friend this morning 11/18/2013 with right eye gaze deviation, left arm hemiplegia and incontinent of stool and urine. CT head obtained on arrival shows a dense right MCA sign and evolving right MCA infarct. Currently patient is in ED, he is able to follow simple commands with his right side, able to say minimal words such as two, five yes but very dysarthric. Patient was not administerd TPA secondary to delay in arrival. Due to risk neurological worsening and impending brain herniation, he was admitted to the neuro ICU for further evaluation and treatment.  SUBJECTIVE Patient stable after peg. Tube feeds started.  OBJECTIVE Most recent Vital Signs: Filed Vitals:   11/28/13 2108 11/29/13 0128 11/29/13 0510 11/29/13 1024  BP: 141/69 118/49 135/79 136/60  Pulse: 56 52 54 51  Temp: 97.9 F (36.6 C) 97.8 F (36.6 C) 97.3 F (36.3 C) 96.4 F (35.8 C)  TempSrc: Oral Oral Oral Axillary  Resp: 20 18 18 18   Height:      Weight:   125 kg (275 lb 9.2 oz)   SpO2: 97% 98% 98% 100%   CBG (last 3)   Recent Labs  11/29/13 0019 11/29/13 0407 11/29/13 0904  GLUCAP 131* 113* 143*   IV Fluid Intake:     MEDICATIONS  . antiseptic oral rinse  15 mL Mouth Rinse q12n4p  . aspirin  325 mg Oral Daily  . carvedilol  3.125 mg Oral BID WC  . chlorhexidine  15 mL Mouth Rinse BID  . diltiazem  90 mg Oral Q6H  . enoxaparin (LOVENOX) injection  40 mg Subcutaneous Daily  . insulin aspart  0-15 Units Subcutaneous Q4H  . irbesartan  75 mg Oral Daily  . sodium chloride  10-40 mL Intracatheter Q12H   PRN:  acetaminophen, ondansetron (ZOFRAN) IV, sodium chloride  Diet:  NPO  Activity:  Up with assistance DVT Prophylaxis:  Lovenox  CLINICALLY SIGNIFICANT STUDIES Basic Metabolic Panel:   Recent Labs Lab 11/27/13 0530 11/28/13 0850  11/29/13 0540  NA 162* 158* 154*  K 4.1 4.1  --   CL 122* 120*  --   CO2 27 28  --   GLUCOSE 179* 167*  --   BUN 43* 31*  --   CREATININE 0.89 0.79  --   CALCIUM 9.0 9.0  --    Liver Function Tests:  No results found for this basename: AST, ALT, ALKPHOS, BILITOT, PROT, ALBUMIN,  in the last 168 hours CBC:   Recent Labs Lab 11/23/13 0320  WBC 10.7*  HGB 14.3  HCT 45.3  MCV 81.6  PLT 134*   Coagulation:  No results found for this basename: LABPROT, INR,  in the last 168 hours Cardiac Enzymes:  No results found for this basename: CKTOTAL, CKMB, CKMBINDEX, TROPONINI,  in the last 168 hours Urinalysis:  No results found for this basename: COLORURINE, APPERANCEUR, LABSPEC, PHURINE, GLUCOSEU, HGBUR, BILIRUBINUR, KETONESUR, PROTEINUR, UROBILINOGEN, NITRITE, LEUKOCYTESUR,  in the last 168 hours Lipid Panel    Component Value Date/Time   CHOL 148 11/19/2013 0316   TRIG 104 11/19/2013 0316   HDL 39* 11/19/2013 0316   CHOLHDL 3.8 11/19/2013 0316   VLDL 21 11/19/2013 0316   LDLCALC 88 11/19/2013 0316   HgbA1C  Lab Results  Component Value Date   HGBA1C 6.9* 11/19/2013    Urine  Drug Screen:   No results found for this basename: labopia,  cocainscrnur,  labbenz,  amphetmu,  thcu,  labbarb    Alcohol Level:  No results found for this basename: ETH,  in the last 168 hours  CT of the brain   11/23/2013- right middle cerebral artery territory infarct with overall decreased mass effect, no hemorrhagic conversion 11/20/2013 . Continued interval evolution of large right MCA territory ischemic infarct with increased right-to-left subfalcine herniation, now measuring 1 cm, previously 5 mm on 11/18/2013. Asymmetric enlargement of the left lateral ventricle is slightly increased, worrisome for possible developing entrapment. 2. No evidence of hemorrhagic conversion.  11/18/2013   1. 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 There is a hyperdense right MCA concerning for  thrombus.   CT Angio Head 11/18/2013  Poor visualization of the right carotid terminus concerning for embolus propagating into the proximal right M1 and right A1 segments, however there is reconstitution distally suggesting nonocclusive embolus or early re- cannulization. Mildly attenuated right M2 and M3 appearance which may be in part due to compressive changes from right cerebral edema and evolving right MCA territory infarct. Worsening right cerebral edema, increasing right to left subfalcine herniation.  Complete circle of Willis.    CT Angio Neck  11/18/2013    No hemodynamically significant stenosis.  Suspected mediastinal lymphadenopathy, partially imaged.    2D Echocardiogram  EF 35-40% with no source of embolus. Diffuse hypokinesis  Carotid Doppler  Preliminary findings: Left = 1-39% ICA stenosis. Antegrade vertebral flow. Right = very limited views due to patient immobility to turn head. Cannot definitely determine grade of stenosis. No obvious significant stenosis noted.  CXR   11/19/2013 Persistent pulmonary infiltrates question pulmonary edema, little changed. 11/18/2013    Central line tip at the brachiocephalic venous confluence. No pneumothorax is identified.   11/18/2013  The findings suggest congestive heart failure with pulmonary interstitial and early alveolar edema.     EKG  normal sinus rhythm. For complete results please see formal report.   Therapy Recommendations SNF  Physical Exam   Obese middle aged african [place] male  . Afebrile. Head is nontraumatic. Neck is supple without bruit.  l. Cardiac exam no murmur or gallop. Lungs are clear to auscultation. Distal pulses are well felt. Neurological Exam : Eyes closed. Pupils of 4 mm sluggishly reactive. Right gaze preference but eyes can be moved reflexively to the midline. Does not blink to threat left more than right. Left lower facial weakness. Tongue midline. Patient does not speak but will follow commands on the right  side consistently. Flaccid left hemiplegia with no withdrawal to painful stimuli. Purposeful antigravity movements on the right side with good tone and strength. Left plantar upgoing right is downgoing. Decreased sensation on the left. Some left-sided neglect. Gait not tested.  ASSESSMENT Todd Vargas is a 68 y.o. male presenting with right eye deviation, left arm hemiplegia and incontinence of stool and urine. Imaging confirms a right MCA infarct with cytotoxic cerebral edema, increasing overnight from 5mm ->10 mm midline shift and right to left subfalcine herniation, concern for potential ventricle entrapment. On hypertonic saline in order to decrease cerebral edema. Infarct felt to be embolic secondary to terminal ICA occlusion from new onset atrial fibrillation.  On no antithrombotics prior to admission. Now on aspirin 325 mg orally every day for secondary stroke prevention. Patient with resultant lethargy, left hemiparesis, mutism, dysphagia, able to follow 1-step commands.    Dr. Danielle DessElsner, Neuro Surgery, does not feel  decompressive craniotomy is in his best interest  Induced hypernatremia with 3% saline in order to decrease cerebral edema, drip stopped days ago, sodium finally starting decrease, Na 154  Atrial Fibrillation - new onset yesterday, not an oral anticoagulation candidate at this time due to NPO status. Placed on cardizem for rate control. CCM added Add Coreg 3.125 / Avapro 75.   CHA2DS2-VASc Score for Atrial Fibrillation Stroke Risk = 5  (?2 oral anticoagulation recommended);  Yearly Stroke risk:  6.7%   Age in Years:  73-74   +1    Sex:  Male   0    Congestive Heart Failure History:  No  0    Hypertension History:  yes   +1    Stroke/TIA/Thromboembolism History:  yes   +2   Vascular Disease History:  No  0    Diabetes Mellitus:  yes   +1 hypertension Hyperlipidemia, LDL 88, on no statin PTA, now on no statin as NPO, goal LDL < 70 for diabetics Diabetes, HgbA1c 6.9, goal < 7.0.  Not on diabetic meds prior to admission. 120-160s Obesity, Body mass index is 34.44 kg/(m^2). Hypokalemia, replaced Ejection fraction 35-40% by echo. Dysphagia secondary to stroke, PEG placed 11/28/2013, ok to restart TF per surgery, they signed off  Family has opted for no intubation - limited DNR   Hospital day # 11  TREATMENT/PLAN  Continue  aspirin 325 mg orally every day for secondary stroke prevention.  Follow glucose  Ensure Na continues to drop, decrease daily Na checks once continues to lower  Anticipate SNF placement at time of discharge in am. Medically stable  For DC to SNF  Delia Heady, MD

## 2013-11-29 NOTE — Clinical Social Work Note (Addendum)
CSW spoke to pt's niece, Misty Stanley, via phone regarding family's choice in SNF placement. Per Misty Stanley, family's first choice is for pt to discharge to [facility] once medically stable. CSW informed Misty Stanley that pt may be ready over weekend. Misty Stanley stated that family was agreeable to potential weekend discharge. CSW confirmed with admission coordinator that pt is able to admit over weekend IF facility can receive discharge summary and AVS on 11/29/2013. CSW has contacted MD's PA regarding information above. PA to complete discharge summary and AVS for CSW to send to facility. CSW to leave handoff for weekend CSW to complete discharge to [facility].  When pt discharges, RN to call report to [phone]  Darlyn Chamber, [facility] Clinical Social Worker [phone]

## 2013-11-29 NOTE — Progress Notes (Signed)
Physical Therapy Treatment Patient Details Name: Todd Vargas MRN: [id number] DOB: [birth year] Today's Date: 11/29/2013 Time: 0160-1093 PT Time Calculation (min): 26 min  PT Assessment / Plan / Recommendation  History of Present Illness 68 yo with 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   PT Comments   Attempted to work with patient EOB. Limited by fatigue and heavy L side lean. Patient with new PEG. Needed another abdominal binder and it was not availible. Continue to recommend SNF for ongoing Physical Therapy.     Follow Up Recommendations  SNF;Supervision/Assistance - 24 hour     Does the patient have the potential to tolerate intense rehabilitation     Barriers to Discharge        Equipment Recommendations  None recommended by PT    Recommendations for Other Services    Frequency Min 3X/week   Progress towards PT Goals Progress towards PT goals: Not progressing toward goals - comment  Plan Current plan remains appropriate    Precautions / Restrictions Precautions Precautions: Fall Precaution Comments: Lt side neglect and flaccid    Pertinent Vitals/Pain no apparent distress     Mobility  Bed Mobility Overal bed mobility: + 2 for safety/equipment;+2 for physical assistance;Needs Assistance Bed Mobility: Rolling;Supine to Sit;Sit to Supine Rolling: +2 for physical assistance;Total assist Supine to sit: +2 for physical assistance;Total assist Sit to supine: HOB elevated;+2 for physical assistance;+2 for safety/equipment;Total assist General bed mobility comments: Pt able to use Rt UE to pull on handrail and attempt transfer; pt unable to advance Lt UE and LE; A for trunk support. Attempted to get fully upright however patient continued with heavy L lean    Exercises     PT Diagnosis:    PT Problem List:   PT Treatment Interventions:     PT Goals (current goals can now be found in the care plan section)    Visit Information  Last PT Received On: 11/29/13 Assistance  Needed: +2 PT/OT/SLP Co-Evaluation/Treatment: Yes Reason for Co-Treatment: Complexity of the patient's impairments (multi-system involvement) PT goals addressed during session: Balance;Mobility/safety with mobility History of Present Illness: 68 yo with 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    Subjective Data      Cognition  Cognition Arousal/Alertness: Awake/alert Behavior During Therapy: Flat affect Overall Cognitive Status: Impaired/Different from baseline Area of Impairment: Following commands;Problem solving;Attention Following Commands: Follows one step commands with increased time Problem Solving: Slow processing;Decreased initiation;Difficulty sequencing;Requires verbal cues;Requires tactile cues General Comments: L sided neglect, able to complete functional tasks with R UE     Balance  Balance Sitting balance-Leahy Scale: Zero Sitting balance - Comments: required +2 throughout. heavy left lean. unable to grab handrail this session Postural control: Posterior lean;Left lateral lean  End of Session PT - End of Session Activity Tolerance: Patient limited by fatigue Patient left: in bed;with call bell/phone within reach Nurse Communication: Mobility status;Need for lift equipment   GP     Kenedee Molesky, Adline Potter 11/29/2013, 2:36 PM 11/29/2013 Fredrich Birks PTA [phone] pager [phone] office

## 2013-11-29 NOTE — Progress Notes (Signed)
Patient ID: Todd Vargas, male   DOB: Jun 08, 1946, 68 y.o.   MRN: [id number]   LOS: 11 days   Subjective: C/o pain at PEG site.   Objective: Vital signs in last 24 hours: Temp:  [97.3 F (36.3 C)-98.1 F (36.7 C)] 97.3 F (36.3 C) (02/06 0510) Pulse Rate:  [48-65] 54 (02/06 0510) Resp:  [12-22] 18 (02/06 0510) BP: (118-170)/(49-82) 135/79 mmHg (02/06 0510) SpO2:  [96 %-100 %] 98 % (02/06 0510) Weight:  [275 lb 9.2 oz (125 kg)] 275 lb 9.2 oz (125 kg) (02/06 0510) Last BM Date: 11/27/13   Laboratory  BMET  Recent Labs  11/27/13 0530 11/28/13 0850 11/29/13 0540  NA 162* 158* 154*  K 4.1 4.1  --   CL 122* 120*  --   CO2 27 28  --   GLUCOSE 179* 167*  --   BUN 43* 31*  --   CREATININE 0.89 0.79  --   CALCIUM 9.0 9.0  --    CBG (last 3)   Recent Labs  11/28/13 2016 11/29/13 0019 11/29/13 0407  GLUCAP 154* 131* 113*    Physical Exam General appearance: no distress Cardio: regular rate and rhythm GI: Soft, NT, PEG site WNL, abd binder not large enough   Assessment/Plan: Dysphagia s/p PEG tube placement -- Can restart TF. Get second abd binder to protect PEG. Surgery will sign off. Please call with questions.    Freeman Caldron, PA-C Pager: [phone] 11/29/2013

## 2013-11-29 NOTE — Progress Notes (Signed)
Tube feedings resumed per MD order.

## 2013-11-29 NOTE — Progress Notes (Signed)
Speech Language Pathology Treatment: Dysphagia  Patient Details Name: Todd Vargas MRN: [id number] DOB: 07-14-1946 Today's Date: 11/29/2013 Time: 8295-6213 SLP Time Calculation (min): 10 min  Assessment / Plan / Recommendation Clinical Impression  Pt had PEG procedure yesterday.  Continuing to follow for dysphagia/cognition and readiness for instrumental swallow study.  Today, pt in bed, alert but requiring max tactile/verbal cues to sustain attention to task.  Answered basic biographical questions,requiring cues after as much as 30 sec delay.  Demonstrated oral recognition of ice chips, but continues with significant cranial nerve involvement on left impacting sensation and motor function of masticators and pharyngeal musculature.  Pt with severe oral holding; severe delay in swallow initiation (swallow response not occurring consistently.) Swallow weak per palpation.  Poor PO readiness at this time.    Will continue efforts; recommend intensive SLP f/u for cognition and dysphagia at SNF.    HPI HPI: 68 yo admitted on 1/26 w/ history of DM, HTN.  CT revealed continued interval evolution of large right MCA territory ischemic infarct with increased right-to-left subfalcine herniation, now measuring 1 cm, previously 5 mm on 11/18/2013. Deficits included: severe dysarthria and left sided neglect. PCCM asked to assess for CVL placement in effort to provide hypertonic saline and out of concern for inability to protect airway / impending respiratory failure.  Pt. did not require intubation. PEG 11/28/13      SLP Plan  Continue with current plan of care    Recommendations Medication Administration: Via alternative means              Oral Care Recommendations: Oral care Q4 per protocol Follow up Recommendations: Skilled Nursing facility Plan: Continue with current plan of care   Todd Vargas, [place] CCC/SLP Pager [phone]      Todd Vargas 11/29/2013, 12:39 PM

## 2013-11-30 LAB — GLUCOSE, CAPILLARY
GLUCOSE-CAPILLARY: 157 mg/dL — AB (ref 70–99)
GLUCOSE-CAPILLARY: 212 mg/dL — AB (ref 70–99)
Glucose-Capillary: 159 mg/dL — ABNORMAL HIGH (ref 70–99)

## 2013-11-30 LAB — SODIUM: Sodium: 157 mEq/L — ABNORMAL HIGH (ref 137–147)

## 2013-11-30 NOTE — Progress Notes (Signed)
Discharge orders received, pt for discharge to [facility] today, report called to [place], [place].  Pt bathed and dressed for transport. Left IJ and coude catheter to remain intact for transport. D/C instructions and Rx given in SNF packet. [facility] brought pt downstairs via stretcher.

## 2013-11-30 NOTE — Progress Notes (Signed)
Patient is medically stable for D/C to [facility] today. Clinical Child psychotherapist (CSW) spoke with Agricultural consultant who reported that patient can come today. CSW prepared D/C packet and arranged non-emergency EMS ([facility]) for transport. CSW contacted patient's niece Misty Stanley and made her aware of above. Nursing is aware of above. Please reconsult if further social work needs arise. CSW signing off.   Jetta Lout, LCSWA Weekend CSW [phone]

## 2013-11-30 NOTE — Progress Notes (Signed)
The patient is ready for discharge to a skilled nursing facility today.  Delton See PA-C [facility] Pager [phone] 11/30/2013, 12:36 PM  Agree with d/c to [facility] today

## 2013-11-30 NOTE — Progress Notes (Signed)
Progress tube feedings as tolerated.  Marta Lamas. Gae Bon, MD, FACS [phone] [facility] Surgery

## 2013-12-02 ENCOUNTER — Encounter (HOSPITAL_COMMUNITY): Payer: Self-pay | Admitting: General Practice

## 2013-12-02 ENCOUNTER — Non-Acute Institutional Stay (SKILLED_NURSING_FACILITY): Payer: Medicare Other | Admitting: Internal Medicine

## 2013-12-02 ENCOUNTER — Encounter: Payer: Self-pay | Admitting: Internal Medicine

## 2013-12-02 DIAGNOSIS — Z931 Gastrostomy status: Secondary | ICD-10-CM | POA: Insufficient documentation

## 2013-12-02 DIAGNOSIS — I5022 Chronic systolic (congestive) heart failure: Secondary | ICD-10-CM

## 2013-12-02 DIAGNOSIS — R1310 Dysphagia, unspecified: Secondary | ICD-10-CM

## 2013-12-02 DIAGNOSIS — I10 Essential (primary) hypertension: Secondary | ICD-10-CM

## 2013-12-02 DIAGNOSIS — I639 Cerebral infarction, unspecified: Secondary | ICD-10-CM

## 2013-12-02 DIAGNOSIS — E785 Hyperlipidemia, unspecified: Secondary | ICD-10-CM | POA: Insufficient documentation

## 2013-12-02 DIAGNOSIS — E119 Type 2 diabetes mellitus without complications: Secondary | ICD-10-CM

## 2013-12-02 DIAGNOSIS — I6350 Cerebral infarction due to unspecified occlusion or stenosis of unspecified cerebral artery: Secondary | ICD-10-CM

## 2013-12-02 DIAGNOSIS — I509 Heart failure, unspecified: Secondary | ICD-10-CM

## 2013-12-02 DIAGNOSIS — I4891 Unspecified atrial fibrillation: Secondary | ICD-10-CM

## 2013-12-02 DIAGNOSIS — E669 Obesity, unspecified: Secondary | ICD-10-CM | POA: Insufficient documentation
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
## 2013-12-02 NOTE — Assessment & Plan Note (Addendum)
HbA1c was 6.9; rec oral when pt stable on PEG feeds; LDH was 88, needs statin: not started in hospital ;will start zocor 10 mg for LDL goal<70; pt on avapro

## 2013-12-02 NOTE — Assessment & Plan Note (Signed)
New onset while hospitalized;on diltiazem, coreg  And Xarelto as prophylaxis

## 2013-12-02 NOTE — Assessment & Plan Note (Signed)
Presentation was R side guaze, L arm hemiplegia and incontinence. CT- evolving R MCA infarct with cytotoxic brain edema with midline shift; N surg did not feel decompressive craniotomy in pt's best interest and pt was txed with hypertonic saline to dec cerebral edema and pt made DNR Infarct felt to be secondary to embolic occlusion of terminal ICA form new onset A fib Lethargy, L hemiparesis, mutism, dysphagia, able to follow 1 step commands

## 2013-12-02 NOTE — Assessment & Plan Note (Signed)
On  Coreg BID, ARB and statin being added;on xarelto

## 2013-12-02 NOTE — Assessment & Plan Note (Signed)
LDL 88;needs to be <70;zocor 10 mg started

## 2013-12-02 NOTE — Assessment & Plan Note (Signed)
Appropriate calorie intake with PEG tube feeds

## 2013-12-02 NOTE — Assessment & Plan Note (Signed)
Avapro, dilt and coreg controlling

## 2013-12-02 NOTE — Assessment & Plan Note (Signed)
Secondary to stroke, PEG tube placed

## 2013-12-10 ENCOUNTER — Emergency Department (HOSPITAL_COMMUNITY): Payer: Medicare Other

## 2013-12-10 ENCOUNTER — Inpatient Hospital Stay (HOSPITAL_COMMUNITY)
Admission: EM | Admit: 2013-12-10 | Discharge: 2013-12-19 | DRG: 871 | Disposition: A | Discharge status: Hospice | Payer: Medicare Other | Attending: Internal Medicine | Admitting: Internal Medicine

## 2013-12-10 ENCOUNTER — Encounter (HOSPITAL_COMMUNITY): Payer: Self-pay | Admitting: Emergency Medicine

## 2013-12-10 DIAGNOSIS — I498 Other specified cardiac arrhythmias: Secondary | ICD-10-CM | POA: Diagnosis not present

## 2013-12-10 DIAGNOSIS — M545 Low back pain, unspecified: Secondary | ICD-10-CM | POA: Diagnosis present

## 2013-12-10 DIAGNOSIS — E1169 Type 2 diabetes mellitus with other specified complication: Secondary | ICD-10-CM

## 2013-12-10 DIAGNOSIS — Z833 Family history of diabetes mellitus: Secondary | ICD-10-CM

## 2013-12-10 DIAGNOSIS — F411 Generalized anxiety disorder: Secondary | ICD-10-CM | POA: Diagnosis not present

## 2013-12-10 DIAGNOSIS — Z6833 Body mass index (BMI) 33.0-33.9, adult: Secondary | ICD-10-CM

## 2013-12-10 DIAGNOSIS — Z886 Allergy status to analgesic agent status: Secondary | ICD-10-CM

## 2013-12-10 DIAGNOSIS — I639 Cerebral infarction, unspecified: Secondary | ICD-10-CM

## 2013-12-10 DIAGNOSIS — R4182 Altered mental status, unspecified: Secondary | ICD-10-CM

## 2013-12-10 DIAGNOSIS — Z79899 Other long term (current) drug therapy: Secondary | ICD-10-CM

## 2013-12-10 DIAGNOSIS — K219 Gastro-esophageal reflux disease without esophagitis: Secondary | ICD-10-CM | POA: Diagnosis present

## 2013-12-10 DIAGNOSIS — I509 Heart failure, unspecified: Secondary | ICD-10-CM | POA: Diagnosis present

## 2013-12-10 DIAGNOSIS — Z9089 Acquired absence of other organs: Secondary | ICD-10-CM

## 2013-12-10 DIAGNOSIS — I10 Essential (primary) hypertension: Secondary | ICD-10-CM | POA: Diagnosis present

## 2013-12-10 DIAGNOSIS — E870 Hyperosmolality and hypernatremia: Secondary | ICD-10-CM | POA: Diagnosis present

## 2013-12-10 DIAGNOSIS — I619 Nontraumatic intracerebral hemorrhage, unspecified: Secondary | ICD-10-CM | POA: Diagnosis present

## 2013-12-10 DIAGNOSIS — I69959 Hemiplegia and hemiparesis following unspecified cerebrovascular disease affecting unspecified side: Secondary | ICD-10-CM

## 2013-12-10 DIAGNOSIS — E669 Obesity, unspecified: Secondary | ICD-10-CM | POA: Diagnosis present

## 2013-12-10 DIAGNOSIS — J9600 Acute respiratory failure, unspecified whether with hypoxia or hypercapnia: Secondary | ICD-10-CM | POA: Diagnosis not present

## 2013-12-10 DIAGNOSIS — E8729 Other acidosis: Secondary | ICD-10-CM | POA: Diagnosis present

## 2013-12-10 DIAGNOSIS — G9340 Encephalopathy, unspecified: Secondary | ICD-10-CM

## 2013-12-10 DIAGNOSIS — N390 Urinary tract infection, site not specified: Secondary | ICD-10-CM | POA: Diagnosis present

## 2013-12-10 DIAGNOSIS — Z515 Encounter for palliative care: Secondary | ICD-10-CM

## 2013-12-10 DIAGNOSIS — J690 Pneumonitis due to inhalation of food and vomit: Secondary | ICD-10-CM | POA: Diagnosis present

## 2013-12-10 DIAGNOSIS — E1165 Type 2 diabetes mellitus with hyperglycemia: Secondary | ICD-10-CM

## 2013-12-10 DIAGNOSIS — E785 Hyperlipidemia, unspecified: Secondary | ICD-10-CM | POA: Diagnosis present

## 2013-12-10 DIAGNOSIS — I69359 Hemiplegia and hemiparesis following cerebral infarction affecting unspecified side: Secondary | ICD-10-CM

## 2013-12-10 DIAGNOSIS — IMO0002 Reserved for concepts with insufficient information to code with codable children: Secondary | ICD-10-CM | POA: Diagnosis present

## 2013-12-10 DIAGNOSIS — Z66 Do not resuscitate: Secondary | ICD-10-CM | POA: Diagnosis present

## 2013-12-10 DIAGNOSIS — R1310 Dysphagia, unspecified: Secondary | ICD-10-CM | POA: Diagnosis present

## 2013-12-10 DIAGNOSIS — I5022 Chronic systolic (congestive) heart failure: Secondary | ICD-10-CM | POA: Diagnosis present

## 2013-12-10 DIAGNOSIS — R6520 Severe sepsis without septic shock: Secondary | ICD-10-CM

## 2013-12-10 DIAGNOSIS — G9341 Metabolic encephalopathy: Secondary | ICD-10-CM | POA: Diagnosis present

## 2013-12-10 DIAGNOSIS — Z8711 Personal history of peptic ulcer disease: Secondary | ICD-10-CM

## 2013-12-10 DIAGNOSIS — Z8249 Family history of ischemic heart disease and other diseases of the circulatory system: Secondary | ICD-10-CM

## 2013-12-10 DIAGNOSIS — I4891 Unspecified atrial fibrillation: Secondary | ICD-10-CM | POA: Diagnosis present

## 2013-12-10 DIAGNOSIS — G8929 Other chronic pain: Secondary | ICD-10-CM | POA: Diagnosis present

## 2013-12-10 DIAGNOSIS — E860 Dehydration: Secondary | ICD-10-CM | POA: Diagnosis present

## 2013-12-10 DIAGNOSIS — Z931 Gastrostomy status: Secondary | ICD-10-CM

## 2013-12-10 DIAGNOSIS — R0681 Apnea, not elsewhere classified: Secondary | ICD-10-CM | POA: Diagnosis present

## 2013-12-10 DIAGNOSIS — E872 Acidosis: Secondary | ICD-10-CM

## 2013-12-10 DIAGNOSIS — I739 Peripheral vascular disease, unspecified: Secondary | ICD-10-CM | POA: Diagnosis present

## 2013-12-10 DIAGNOSIS — A419 Sepsis, unspecified organism: Principal | ICD-10-CM | POA: Diagnosis present

## 2013-12-10 HISTORY — DX: Cerebral infarction, unspecified: I63.9

## 2013-12-10 LAB — COMPREHENSIVE METABOLIC PANEL
ALT: 49 U/L (ref 0–53)
AST: 41 U/L — ABNORMAL HIGH (ref 0–37)
Albumin: 3.1 g/dL — ABNORMAL LOW (ref 3.5–5.2)
Alkaline Phosphatase: 191 U/L — ABNORMAL HIGH (ref 39–117)
BILIRUBIN TOTAL: 0.4 mg/dL (ref 0.3–1.2)
BUN: 42 mg/dL — ABNORMAL HIGH (ref 6–23)
CALCIUM: 9.8 mg/dL (ref 8.4–10.5)
CO2: 22 meq/L (ref 19–32)
Chloride: 106 mEq/L (ref 96–112)
Creatinine, Ser: 0.88 mg/dL (ref 0.50–1.35)
GFR, EST NON AFRICAN AMERICAN: 86 mL/min — AB (ref >90)
GLUCOSE: 534 mg/dL — AB (ref 70–99)
POTASSIUM: 4.8 meq/L (ref 3.7–5.3)
Sodium: 144 mEq/L (ref 137–147)
Total Protein: 7.9 g/dL (ref 6.0–8.3)

## 2013-12-10 LAB — URINE MICROSCOPIC-ADD ON

## 2013-12-10 LAB — POCT I-STAT TROPONIN I: TROPONIN I, POC: 0.02 ng/mL (ref 0.00–0.08)

## 2013-12-10 LAB — POCT I-STAT 3, ART BLOOD GAS (G3+)
ACID-BASE EXCESS: 2 mmol/L (ref 0.0–2.0)
BICARBONATE: 23.4 meq/L (ref 20.0–24.0)
O2 Saturation: 98 %
PCO2 ART: 28.6 mmHg — AB (ref 35.0–45.0)
PO2 ART: 94 mmHg (ref 80.0–100.0)
Patient temperature: 98.6
TCO2: 24 mmol/L (ref 0–100)
pH, Arterial: 7.522 — ABNORMAL HIGH (ref 7.350–7.450)

## 2013-12-10 LAB — CBC WITH DIFFERENTIAL/PLATELET
Basophils Absolute: 0 10*3/uL (ref 0.0–0.1)
Basophils Relative: 0 % (ref 0–1)
EOS ABS: 0.1 10*3/uL (ref 0.0–0.7)
Eosinophils Relative: 1 % (ref 0–5)
HCT: 45.9 % (ref 39.0–52.0)
HEMOGLOBIN: 15 g/dL (ref 13.0–17.0)
Lymphocytes Relative: 34 % (ref 12–46)
Lymphs Abs: 3.5 10*3/uL (ref 0.7–4.0)
MCH: 25.7 pg — ABNORMAL LOW (ref 26.0–34.0)
MCHC: 32.7 g/dL (ref 30.0–36.0)
MCV: 78.7 fL (ref 78.0–100.0)
MONOS PCT: 8 % (ref 3–12)
Monocytes Absolute: 0.8 10*3/uL (ref 0.1–1.0)
NEUTROS PCT: 58 % (ref 43–77)
Neutro Abs: 5.9 10*3/uL (ref 1.7–7.7)
Platelets: 173 10*3/uL (ref 150–400)
RBC: 5.83 MIL/uL — AB (ref 4.22–5.81)
RDW: 14.8 % (ref 11.5–15.5)
WBC: 10.2 10*3/uL (ref 4.0–10.5)

## 2013-12-10 LAB — GLUCOSE, CAPILLARY: GLUCOSE-CAPILLARY: 467 mg/dL — AB (ref 70–99)

## 2013-12-10 LAB — URINALYSIS, ROUTINE W REFLEX MICROSCOPIC
Bilirubin Urine: NEGATIVE
Glucose, UA: >1000 mg/dL — AB
Ketones, ur: NEGATIVE mg/dL
Nitrite: NEGATIVE
PROTEIN: 30 mg/dL — AB
Specific Gravity, Urine: 1.03 (ref 1.005–1.030)
Urobilinogen, UA: 1 mg/dL (ref 0.0–1.0)
pH: 5 (ref 5.0–8.0)

## 2013-12-10 LAB — POCT I-STAT, CHEM 8
BUN: 40 mg/dL — ABNORMAL HIGH (ref 6–23)
CALCIUM ION: 1.23 mmol/L (ref 1.13–1.30)
Chloride: 109 mEq/L (ref 96–112)
Creatinine, Ser: 1 mg/dL (ref 0.50–1.35)
Glucose, Bld: 565 mg/dL (ref 70–99)
HEMATOCRIT: 48 % (ref 39.0–52.0)
HEMOGLOBIN: 16.3 g/dL (ref 13.0–17.0)
Potassium: 4.6 mEq/L (ref 3.7–5.3)
Sodium: 146 mEq/L (ref 137–147)
TCO2: 23 mmol/L (ref 0–100)

## 2013-12-10 LAB — CG4 I-STAT (LACTIC ACID): Lactic Acid, Venous: 2.14 mmol/L (ref 0.5–2.2)

## 2013-12-10 MED ORDER — ACETAMINOPHEN 650 MG RE SUPP
650.0000 mg | Freq: Once | RECTAL | Status: AC
  Administered 2013-12-10: 650 mg via RECTAL
  Filled 2013-12-10: qty 1

## 2013-12-10 MED ORDER — SODIUM CHLORIDE 0.9 % IV SOLN
1000.0000 mL | Freq: Once | INTRAVENOUS | Status: AC
  Administered 2013-12-10: 1000 mL via INTRAVENOUS

## 2013-12-10 MED ORDER — DILTIAZEM HCL 100 MG IV SOLR
5.0000 mg/h | Freq: Once | INTRAVENOUS | Status: AC
  Administered 2013-12-11: 10 mg/h via INTRAVENOUS

## 2013-12-10 MED ORDER — VANCOMYCIN HCL 10 G IV SOLR
2000.0000 mg | Freq: Once | INTRAVENOUS | Status: AC
  Administered 2013-12-10: 2000 mg via INTRAVENOUS
  Filled 2013-12-10: qty 2000

## 2013-12-10 MED ORDER — SODIUM CHLORIDE 0.9 % IV SOLN: 1000.0000 mL | INTRAVENOUS | Status: DC

## 2013-12-10 MED ORDER — PIPERACILLIN-TAZOBACTAM 3.375 G IVPB 30 MIN
3.3750 g | Freq: Once | INTRAVENOUS | Status: AC
  Administered 2013-12-10: 3.375 g via INTRAVENOUS
  Filled 2013-12-10: qty 50

## 2013-12-10 MED ORDER — VANCOMYCIN HCL 10 G IV SOLR
1250.0000 mg | Freq: Two times a day (BID) | INTRAVENOUS | Status: DC
  Filled 2013-12-10: qty 1250

## 2013-12-10 MED ORDER — VANCOMYCIN HCL IN DEXTROSE 1-5 GM/200ML-% IV SOLN: 1000.0000 mg | Freq: Once | INTRAVENOUS | Status: DC

## 2013-12-10 MED ORDER — DILTIAZEM HCL 25 MG/5ML IV SOLN
10.0000 mg | Freq: Once | INTRAVENOUS | Status: AC
  Administered 2013-12-11: 10 mg via INTRAVENOUS

## 2013-12-10 MED ORDER — PIPERACILLIN-TAZOBACTAM 3.375 G IVPB: 3.3750 g | Freq: Three times a day (TID) | INTRAVENOUS | Status: DC

## 2013-12-10 NOTE — ED Notes (Signed)
Pt taken over to ct with tech and Dr. Julian Reil at bedside.
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
## 2013-12-10 NOTE — ED Notes (Signed)
Patient arrived via [facility] from [facility] with hyperglycemia. Patient has kussmaul breathing, EKG ST with PVC's and PAC's present. Patient was responding to painful stimuli, currently is not and having apnea.

## 2013-12-11 ENCOUNTER — Encounter (HOSPITAL_COMMUNITY): Payer: Self-pay | Admitting: *Deleted

## 2013-12-11 ENCOUNTER — Ambulatory Visit (HOSPITAL_COMMUNITY): Payer: Medicare Other

## 2013-12-11 DIAGNOSIS — R0989 Other specified symptoms and signs involving the circulatory and respiratory systems: Secondary | ICD-10-CM

## 2013-12-11 DIAGNOSIS — E870 Hyperosmolality and hypernatremia: Secondary | ICD-10-CM | POA: Diagnosis present

## 2013-12-11 DIAGNOSIS — E872 Acidosis: Secondary | ICD-10-CM | POA: Diagnosis present

## 2013-12-11 DIAGNOSIS — I4891 Unspecified atrial fibrillation: Secondary | ICD-10-CM

## 2013-12-11 DIAGNOSIS — E8729 Other acidosis: Secondary | ICD-10-CM | POA: Diagnosis present

## 2013-12-11 DIAGNOSIS — I69359 Hemiplegia and hemiparesis following cerebral infarction affecting unspecified side: Secondary | ICD-10-CM

## 2013-12-11 DIAGNOSIS — R4182 Altered mental status, unspecified: Secondary | ICD-10-CM

## 2013-12-11 DIAGNOSIS — E860 Dehydration: Secondary | ICD-10-CM | POA: Diagnosis present

## 2013-12-11 DIAGNOSIS — R0609 Other forms of dyspnea: Secondary | ICD-10-CM

## 2013-12-11 DIAGNOSIS — I619 Nontraumatic intracerebral hemorrhage, unspecified: Secondary | ICD-10-CM

## 2013-12-11 DIAGNOSIS — N390 Urinary tract infection, site not specified: Secondary | ICD-10-CM

## 2013-12-11 DIAGNOSIS — A419 Sepsis, unspecified organism: Secondary | ICD-10-CM | POA: Diagnosis present

## 2013-12-11 LAB — GLUCOSE, CAPILLARY
GLUCOSE-CAPILLARY: 447 mg/dL — AB (ref 70–99)
GLUCOSE-CAPILLARY: 429 mg/dL — AB (ref 70–99)
GLUCOSE-CAPILLARY: 414 mg/dL — AB (ref 70–99)
GLUCOSE-CAPILLARY: 343 mg/dL — AB (ref 70–99)
GLUCOSE-CAPILLARY: 293 mg/dL — AB (ref 70–99)
GLUCOSE-CAPILLARY: 167 mg/dL — AB (ref 70–99)
GLUCOSE-CAPILLARY: 237 mg/dL — AB (ref 70–99)
GLUCOSE-CAPILLARY: 211 mg/dL — AB (ref 70–99)
Glucose-Capillary: 501 mg/dL — ABNORMAL HIGH (ref 70–99)
Glucose-Capillary: 232 mg/dL — ABNORMAL HIGH (ref 70–99)
Glucose-Capillary: 205 mg/dL — ABNORMAL HIGH (ref 70–99)
Glucose-Capillary: 239 mg/dL — ABNORMAL HIGH (ref 70–99)

## 2013-12-11 LAB — BASIC METABOLIC PANEL
BUN: 39 mg/dL — ABNORMAL HIGH (ref 6–23)
CALCIUM: 9 mg/dL (ref 8.4–10.5)
CO2: 20 mEq/L (ref 19–32)
CREATININE: 0.83 mg/dL (ref 0.50–1.35)
Chloride: 113 mEq/L — ABNORMAL HIGH (ref 96–112)
GFR calc Af Amer: >90 mL/min (ref >90)
GFR calc non Af Amer: 88 mL/min — ABNORMAL LOW (ref >90)
GLUCOSE: 462 mg/dL — AB (ref 70–99)
Potassium: 4.4 mEq/L (ref 3.7–5.3)
Sodium: 148 mEq/L — ABNORMAL HIGH (ref 137–147)

## 2013-12-11 LAB — CBC
HCT: 46.5 % (ref 39.0–52.0)
HEMOGLOBIN: 15.1 g/dL (ref 13.0–17.0)
MCH: 25.7 pg — AB (ref 26.0–34.0)
MCHC: 32.5 g/dL (ref 30.0–36.0)
MCV: 79.2 fL (ref 78.0–100.0)
Platelets: 125 10*3/uL — ABNORMAL LOW (ref 150–400)
RBC: 5.87 MIL/uL — ABNORMAL HIGH (ref 4.22–5.81)
RDW: 14.8 % (ref 11.5–15.5)
WBC: 8.9 10*3/uL (ref 4.0–10.5)

## 2013-12-11 MED ORDER — INSULIN ASPART 100 UNIT/ML ~~LOC~~ SOLN
0.0000 [IU] | SUBCUTANEOUS | Status: DC
  Administered 2013-12-11 – 2013-12-12 (×6): 5 [IU] via SUBCUTANEOUS
  Administered 2013-12-12: 8 [IU] via SUBCUTANEOUS
  Administered 2013-12-12: 11 [IU] via SUBCUTANEOUS
  Administered 2013-12-12: 5 [IU] via SUBCUTANEOUS
  Administered 2013-12-12: 8 [IU] via SUBCUTANEOUS
  Administered 2013-12-13 (×3): 3 [IU] via SUBCUTANEOUS
  Administered 2013-12-13: 2 [IU] via SUBCUTANEOUS

## 2013-12-11 MED ORDER — CARVEDILOL 3.125 MG PO TABS
3.1250 mg | ORAL_TABLET | Freq: Two times a day (BID) | ORAL | Status: DC
  Filled 2013-12-11 (×3): qty 1

## 2013-12-11 MED ORDER — OSMOLITE 1.5 CAL PO LIQD
1000.0000 mL | ORAL | Status: DC
  Filled 2013-12-11 (×3): qty 1000

## 2013-12-11 MED ORDER — ACETAMINOPHEN 160 MG/5ML PO SOLN
650.0000 mg | Freq: Four times a day (QID) | ORAL | Status: DC | PRN
  Administered 2013-12-11 – 2013-12-12 (×2): 650 mg
  Filled 2013-12-11 (×3): qty 20.3

## 2013-12-11 MED ORDER — METOPROLOL TARTRATE 1 MG/ML IV SOLN: 2.5000 mg | Freq: Four times a day (QID) | INTRAVENOUS | Status: DC | PRN

## 2013-12-11 MED ORDER — DEXTROSE 5 % IV SOLN: 1.0000 g | INTRAVENOUS | Status: DC

## 2013-12-11 MED ORDER — DILTIAZEM 12 MG/ML ORAL SUSPENSION
90.0000 mg | Freq: Four times a day (QID) | ORAL | Status: DC
  Filled 2013-12-11 (×5): qty 9

## 2013-12-11 MED ORDER — SODIUM CHLORIDE 0.9 % IV SOLN
INTRAVENOUS | Status: DC
  Administered 2013-12-11: 03:00:00 via INTRAVENOUS

## 2013-12-11 MED ORDER — INSULIN GLARGINE 100 UNIT/ML ~~LOC~~ SOLN
10.0000 [IU] | Freq: Every day | SUBCUTANEOUS | Status: DC
  Administered 2013-12-11 – 2013-12-15 (×5): 10 [IU] via SUBCUTANEOUS
  Filled 2013-12-11 (×5): qty 0.1

## 2013-12-11 MED ORDER — DEXTROSE 5 % IV SOLN
1.0000 g | INTRAVENOUS | Status: DC
  Administered 2013-12-11 – 2013-12-12 (×2): 1 g via INTRAVENOUS
  Filled 2013-12-11 (×2): qty 10

## 2013-12-11 MED ORDER — SODIUM CHLORIDE 0.9 % IJ SOLN: 3.0000 mL | Freq: Two times a day (BID) | INTRAMUSCULAR | Status: DC

## 2013-12-11 MED ORDER — FREE WATER
100.0000 mL | Freq: Three times a day (TID) | Status: DC
  Administered 2013-12-11 – 2013-12-12 (×2): 100 mL

## 2013-12-11 MED ORDER — DILTIAZEM HCL 100 MG IV SOLR
5.0000 mg/h | INTRAVENOUS | Status: DC
  Filled 2013-12-11: qty 100

## 2013-12-11 MED ORDER — DEXTROSE 50 % IV SOLN: 25.0000 mL | INTRAVENOUS | Status: DC | PRN

## 2013-12-11 MED ORDER — DEXTROSE-NACL 5-0.45 % IV SOLN: INTRAVENOUS | Status: DC

## 2013-12-11 MED ORDER — METOPROLOL TARTRATE 1 MG/ML IV SOLN
2.5000 mg | Freq: Four times a day (QID) | INTRAVENOUS | Status: DC | PRN
  Administered 2013-12-12: 2.5 mg via INTRAVENOUS
  Filled 2013-12-11: qty 5

## 2013-12-11 MED ORDER — SODIUM CHLORIDE 0.9 % IV SOLN
INTRAVENOUS | Status: DC
  Administered 2013-12-11: 17:00:00 via INTRAVENOUS

## 2013-12-11 MED ORDER — SODIUM CHLORIDE 0.9 % IV SOLN
INTRAVENOUS | Status: DC
  Administered 2013-12-11: 6.4 [IU]/h via INTRAVENOUS
  Administered 2013-12-11: 10.3 [IU]/h via INTRAVENOUS
  Administered 2013-12-11: 4.4 [IU]/h via INTRAVENOUS
  Filled 2013-12-11: qty 1

## 2013-12-11 MED ORDER — INSULIN REGULAR BOLUS VIA INFUSION
0.0000 [IU] | Freq: Three times a day (TID) | INTRAVENOUS | Status: DC
  Filled 2013-12-11: qty 10

## 2013-12-11 MED ORDER — CARVEDILOL 3.125 MG PO TABS
3.1250 mg | ORAL_TABLET | Freq: Two times a day (BID) | ORAL | Status: DC
  Filled 2013-12-11 (×4): qty 1

## 2013-12-11 MED ORDER — IRBESARTAN 75 MG PO TABS
75.0000 mg | ORAL_TABLET | Freq: Every day | ORAL | Status: DC
  Filled 2013-12-11: qty 1

## 2013-12-11 MED ORDER — SIMVASTATIN 10 MG PO TABS
10.0000 mg | ORAL_TABLET | Freq: Every day | ORAL | Status: DC
  Filled 2013-12-11: qty 1
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
## 2013-12-11 NOTE — Progress Notes (Signed)
Pt admitted into room 3S15 with no belongings. Insulin gtt and cardizem gtt infusing. Pt only responds to painful stimuli. Cardizem gtt order clarified with MD, also orders to hold tube feeding while pt is on glucostabilizer.

## 2013-12-11 NOTE — ED Notes (Signed)
Rectal temp obtained 101.3

## 2013-12-11 NOTE — Progress Notes (Signed)
Utilization review completed.  

## 2013-12-11 NOTE — Progress Notes (Signed)
Physician notified: Sharon Seller At: 1000  Regarding: Niece at bedside. Night cover had questions for family about plan of care. At [facility] on comfort care, here DNR?   1023: MD to room.

## 2013-12-11 NOTE — Progress Notes (Signed)
Physician notified: Sharon Seller At: 1156  Regarding: FYI pt had 20 beat run (6.5sec) VTach. Now NSR. Will continue to monitor.

## 2013-12-11 NOTE — Progress Notes (Signed)
Todd Vargas - Stepdown / ICU Progress Note  Todd Vargas YKD:[id number] DOB: 1946/09/04 DOA: 12/10/2013 PCP: Todd Headings, MD  Brief narrative: 68 year old male patient recently admitted on 11/18/2013 after suffering a massive right MCA ischemic stroke due to previously undiagnosed atrial fibrillation. That hospital course was complicated by subfalcine herniation as well as significant cerebral edema that required hypertonic saline. Prior to discharge patient had a baseline of severe left-sided hemi-neglect with other neurological deficits and was requiring a PEG tube for medications and feedings. Apparently at time of discharge he was awake and able to follow commands with the right side. Unfortunately over the past several days at the nursing home the patient has developed altered mentation and was minimally responsive and therefore sent to the emergency department. In the ER he was found to have a fever of 101.3, he had tachypnea, urinalysis consistent with UTI. He also had hyperglycemia without ketoacidosis and appeared to be severely dehydrated.  Assessment/Plan:  Sepsis -Due to urinary tract infection -Continue empiric antibiotics and other supportive care -Followup on all cultures -DO NOT RESUSCITATE and after discussion with family will not escalate care but we'll continue current treatment efforts  Metabolic encephalopathy -Likely related to sepsis and infectious processes -Followup on EEG -Hold MRI and consider pursuing again if patient stabilizes  UTI  -Urinalysis highly abnormal  -Followup on culture and narrow antibiotics as able   Atrial fibrillation with RVR -likely driven by dehydration and stress from sepsis - initially required Cardizem drip for rate control but this has subsequently been weaned off -Blood pressure soft so we'll provide when necessary IV Lopressor based on heart rate  Diabetes mellitus type 2, uncontrolled -Discontinue insulin infusion in  favor of low-dose Lantus + SSI -Follow CBG every 4 hours but watch closely since currently holding tube feeding  Hypertension -Blood pressure soft so antihypertensive medications on hold  Dehydration/Hypernatremia -Continue IV fluids at 100 cc per hour - has known ejection fraction of 35 % to 40%  Chronic systolic heart failure -Compensated / DH presently   Dysphagia/PEG (percutaneous endoscopic gastrostomy) status -Hypoactive bowel sounds in the setting of acute sepsis therefore tube feedings on hold  History of significant ischemic stroke with residual hemiparesis -Prognosis poor -Patient niece at bedside and discussed via telephone with patient's sister prognosis -Current plan is to continue treating any reversible causes of infection and monitor for improvement noting if no improvement likely will progress to comfort care  DVT prophylaxis: SCDs Code Status: DO NOT RESUSCITATE Family Communication: Patient's niece and sister Disposition Plan/Expected LOS: SDU  Consultants: Neurology   Procedures: None  Antibiotics: Zosyn 2/17 >>> Vancomycin 2/17 >>>  HPI/Subjective: Unresponsive. Spoke to patient's niece at the bedside.  Objective: Blood pressure 88/56, pulse 92, temperature 99.[name] (37.3 C), temperature source Axillary, resp. rate 27, height 6' ([name] m), weight 262 lb 12.6 oz (119.2 kg), SpO2 97.00%.  Intake/Output Summary (Last 24 hours) at 12/11/13 1339 Last data filed at 12/11/13 0700  Gross per 24 hour  Intake  321.9 ml  Output    175 ml  Net  146.9 ml    Exam: Followup exam completed. Patient admitted at [name] AM today  Scheduled Meds:  Scheduled Meds: . cefTRIAXone (ROCEPHIN)  IV  [name]  . insulin aspart  0-15 Units Subcutaneous 6 times per day   Data Reviewed: Basic Metabolic Panel:  Recent Labs Lab 12/10/13 2112 12/10/13 2121 12/11/13 0451  NA 144 146 148*  K 4.8 4.6  4.4  CL 106 109 113*  CO2 22  --  20  GLUCOSE 534*  565* 462*  BUN 42* 40* 39*  CREATININE 0.88 [name] 0.83  CALCIUM 9.8  --  9.0   Liver Function Tests:  Recent Labs Lab 12/10/13 2112  AST 41*  ALT 49  ALKPHOS 191*  BILITOT 0.4  PROT 7.9  ALBUMIN 3.Vargas*   CBC:  Recent Labs Lab 12/10/13 2112 12/10/13 2121 12/11/13 0451  WBC 10.2  --  8.9  NEUTROABS 5.9  --   --   HGB 15.0 16.3 15.Vargas  HCT 45.9 48.0 46.5  MCV 78.7  --  79.2  PLT 173  --  125*   CBG:  Recent Labs Lab 12/11/13 0615 12/11/13 0700 12/11/13 0810 12/11/13 0921 12/11/13 1208  GLUCAP 343* 293* 232* 167* 205*    No results found for this or any previous visit (from the past 240 hour(s)).   Studies:  Recent x-ray studies have been reviewed in detail by the Attending Physician  Time spent : 25+ mins     Todd Vargas, ANP [facility] Office  [phone] Pager [phone]  **If unable to reach the above provider after paging please contact the Flow Manager @ [phone]  On-Call/Text Page:      [email or web address] [email or web address]      password [email or web address]  If 7PM-7AM, please contact night-coverage [email or web address] Password [email or web address] 12/11/2013, [name]   LOS: [name]   I have personally examined this patient and reviewed the entire database. I have reviewed the above note, made any necessary editorial changes, and agree with its content.  Todd BloodJeffrey T. Russell Engelstad, MD [facility]
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
## 2013-12-11 NOTE — Progress Notes (Signed)
Glucose stabilizer discontinued per order. Insulin gtt DC. NS IVF increased to 136ml/hr. MRI called to cancel scan. Family at bedside. Will continue to monitor.

## 2013-12-11 NOTE — ED Provider Notes (Signed)
Medical screening examination/treatment/procedure(s) were conducted as a shared visit with non-physician practitioner(s) or resident  and myself.  I personally evaluated the patient during the encounter and agree with the findings and plan unless otherwise indicated.   I have personally reviewed any xrays and/ or EKG's with the provider and I agree with interpretation.   Recent large stroke, neuro deficits, on xarelto, a fib hx presents altered mental, hyperglycemia, fever.  Concern for sepsis.  Broad abx, cultures.  Difficult to discern if mental status worse than baseline, no verbal, only responding to painful stimuli, dry mm, tachy, irregular, abdo soft/ no guarding, few rales bilateral, tachypnea, neck supple.   Cardizem bolus/ drip for known a fib.  CT no acute findings. Filed Vitals:   12/10/13 2330 12/11/13 0000 12/11/13 0015 12/11/13 0017  BP: 150/92 132/83 119/103   Pulse: 100     Temp:    101.3 F (38.5 C)  TempSrc:    Rectal  Resp: 26  32   SpO2: 97% 92% 94%    Multiple discussions with hospitalist. I agree with 40 min of critical care documented by PA.  Admitted.  Urosepsis, MCA stroke, AMS, Hyperglycemia  Enid Skeens, MD 12/11/13 0110

## 2013-12-11 NOTE — Progress Notes (Signed)
BP 80s/40s. Cardizem gtt stopped. HR now 150s. MD notified. New orders received to administer 1L NS bolus. Will carry out orders and continue to monitor closely.

## 2013-12-11 NOTE — Progress Notes (Signed)
Subjective: Mild improvement  Exam: Filed Vitals:   12/11/13 1132  BP: 88/56  Pulse: 92  Temp:   Resp: 27   Gen: In bed, NAD MS: Awake, follows commands to make fist, close eyes.  WC:HENID gaze deviation, left facial droop.  Motor: moves right arm and laeg well, no movement on left.  Sensory:grimaces to deep nox stim on left, mild stim on right.   Impression: 68 yo M with recent large right MCA infarct and worsened mental status. Possibilities include "peelign the onion"(worsening of previous deficits in the setting of physiological stressor such as UTI), seizure is a possibility though no clear support for that at this time, multifactorial encephalopathy including infection and dehydrations.   Recommendations: 1) EEG 2) MRI I think would be relatively low yield, but if continues to have new symptoms, could pursue.   Ritta Slot, MD [facility] [phone]  If 7pm- 7am, please page neurology on call at [phone].

## 2013-12-12 DIAGNOSIS — I10 Essential (primary) hypertension: Secondary | ICD-10-CM

## 2013-12-12 DIAGNOSIS — A419 Sepsis, unspecified organism: Secondary | ICD-10-CM

## 2013-12-12 LAB — BASIC METABOLIC PANEL
BUN: 31 mg/dL — AB (ref 6–23)
CO2: 24 mEq/L (ref 19–32)
Calcium: 8.9 mg/dL (ref 8.4–10.5)
Chloride: 115 mEq/L — ABNORMAL HIGH (ref 96–112)
Creatinine, Ser: 0.82 mg/dL (ref 0.50–1.35)
GFR calc Af Amer: >90 mL/min (ref >90)
GFR, EST NON AFRICAN AMERICAN: 89 mL/min — AB (ref >90)
Glucose, Bld: 253 mg/dL — ABNORMAL HIGH (ref 70–99)
POTASSIUM: 4.5 meq/L (ref 3.7–5.3)
SODIUM: 151 meq/L — AB (ref 137–147)

## 2013-12-12 LAB — GLUCOSE, CAPILLARY
Glucose-Capillary: 219 mg/dL — ABNORMAL HIGH (ref 70–99)
Glucose-Capillary: 246 mg/dL — ABNORMAL HIGH (ref 70–99)
Glucose-Capillary: 302 mg/dL — ABNORMAL HIGH (ref 70–99)
Glucose-Capillary: 266 mg/dL — ABNORMAL HIGH (ref 70–99)

## 2013-12-12 LAB — URINE CULTURE: Colony Count: >=100000

## 2013-12-12 MED ORDER — LEVOFLOXACIN IN D5W 750 MG/150ML IV SOLN
750.0000 mg | INTRAVENOUS | Status: DC
  Filled 2013-12-12: qty 150

## 2013-12-12 MED ORDER — WHITE PETROLATUM GEL
Status: AC
  Administered 2013-12-12: 1
  Filled 2013-12-12: qty 5

## 2013-12-12 MED ORDER — DEXTROSE 5 % IV SOLN
INTRAVENOUS | Status: DC
  Administered 2013-12-13 (×2): via INTRAVENOUS
  Administered 2013-12-14: 10 mL via INTRAVENOUS
  Administered 2013-12-14: 13:00:00 via INTRAVENOUS
  Administered 2013-12-15: 10 mL via INTRAVENOUS

## 2013-12-12 MED ORDER — DEXTROSE 5 % IV BOLUS
500.0000 mL | Freq: Once | INTRAVENOUS | Status: AC
  Administered 2013-12-12: 500 mL via INTRAVENOUS

## 2013-12-12 MED ORDER — NYSTATIN 100000 UNIT/ML MT SUSP
5.0000 mL | Freq: Four times a day (QID) | OROMUCOSAL | Status: DC
  Administered 2013-12-12 – 2013-12-16 (×17): 500000 [IU] via ORAL
  Filled 2013-12-12 (×20): qty 5

## 2013-12-12 MED ORDER — FREE WATER
300.0000 mL | Freq: Four times a day (QID) | Status: DC
  Administered 2013-12-12 – 2013-12-14 (×8): 300 mL

## 2013-12-12 MED ORDER — DEXTROSE 5 % IV SOLN
INTRAVENOUS | Status: DC
  Administered 2013-12-12: 10:00:00 via INTRAVENOUS

## 2013-12-12 MED ORDER — FREE WATER
200.0000 mL | Freq: Four times a day (QID) | Status: DC
  Administered 2013-12-12: 200 mL

## 2013-12-12 MED ORDER — VANCOMYCIN HCL 10 G IV SOLR
1250.0000 mg | Freq: Three times a day (TID) | INTRAVENOUS | Status: DC
  Administered 2013-12-13 – 2013-12-14 (×4): 1250 mg via INTRAVENOUS
  Filled 2013-12-12 (×6): qty 1250

## 2013-12-12 MED ORDER — PIPERACILLIN-TAZOBACTAM 3.375 G IVPB
3.3750 g | Freq: Three times a day (TID) | INTRAVENOUS | Status: DC
  Administered 2013-12-12 – 2013-12-16 (×13): 3.375 g via INTRAVENOUS
  Filled 2013-12-12 (×15): qty 50

## 2013-12-12 MED ORDER — DILTIAZEM HCL 100 MG IV SOLR
5.0000 mg/h | INTRAVENOUS | Status: DC
  Administered 2013-12-12: 20 mg/h via INTRAVENOUS
  Administered 2013-12-12: 10 mg/h via INTRAVENOUS
  Filled 2013-12-12 (×2): qty 100

## 2013-12-12 MED ORDER — FLUCONAZOLE 100MG IVPB
100.0000 mg | INTRAVENOUS | Status: AC
  Administered 2013-12-12 – 2013-12-14 (×3): 100 mg via INTRAVENOUS
  Filled 2013-12-12 (×3): qty 50

## 2013-12-12 MED ORDER — VANCOMYCIN HCL 10 G IV SOLR
2000.0000 mg | Freq: Once | INTRAVENOUS | Status: AC
  Administered 2013-12-12: 2000 mg via INTRAVENOUS
  Filled 2013-12-12: qty 2000

## 2013-12-12 MED ORDER — DILTIAZEM LOAD VIA INFUSION
20.0000 mg | Freq: Once | INTRAVENOUS | Status: AC
  Administered 2013-12-12: 20 mg via INTRAVENOUS
  Filled 2013-12-12: qty 20

## 2013-12-12 MED ORDER — INSULIN GLARGINE 100 UNIT/ML ~~LOC~~ SOLN: 10.0000 [IU] | Freq: Once | SUBCUTANEOUS | Status: DC

## 2013-12-12 NOTE — Progress Notes (Signed)
Physician notified: A. Rennis Harding, PA At: [phone]  Regarding: HR 130-140 afib, BP 90/50s, metop given at 0517. Also BS very hypo, sputum honey mustard yellow, brown color, thick. Thanks.  Arrived to room at [phone]

## 2013-12-12 NOTE — Progress Notes (Signed)
[name]  DAMARIYON HARSIN NLG:[id number] DOB: Jul 14, 1946 DOA: 12/10/2013 PCP: Thayer Headings, MD  Brief narrative: 68 year old male patient recently admitted on 11/18/2013 after suffering a massive right MCA ischemic stroke due to previously undiagnosed atrial fibrillation. That hospital course was complicated by subfalcine herniation as well as significant cerebral edema that required hypertonic saline. Prior to discharge patient had a baseline of severe left-sided hemi-neglect with other neurological deficits and was requiring a PEG tube for medications and feedings. Apparently at time of discharge he was awake and able to follow commands with the right side. Unfortunately over the past several days at the nursing home the patient has developed altered mentation and was minimally responsive and therefore sent to the emergency department. In the ER he was found to have a fever of 101.3, he had tachypnea, urinalysis consistent with UTI. He also had hyperglycemia without ketoacidosis and appeared to be severely dehydrated.  Assessment/Plan:  Sepsis -Initially suspected to be related to urinary tract infection and although urinalysis highly abnormal culture was negative but still consider this as possible etiology. Since arrival and after improving hydration patient now with thick yellow brown secretions suctioned orally. Consider HCAP vs aspiration pneumonia -Continue empiric antibiotics and other supportive care -Followup on all cultures -DO NOT RESUSCITATE and after discussion with family will not escalate care but we'll continue current treatment efforts -If no significant improvement in the next 24-48 hours may need to consider discussing with family the possibility of transitioning to comfort care  Acute hypoxemic respiratory failure/?HCAP vs asp PNA -02 requirements have increased since admission and now with thick abnormal-appearing sputum -Continue  supportive care and empiric antibiotic-broaden coverage -DO NOT RESUSCITATE so would not escalate care -Per RN patient does not have any significant gastric residuals when checked  Metabolic encephalopathy -Likely related to sepsis and infectious processes -Followup on EEG -Hold MRI and consider pursuing again if patient stabilizes  UTI  -Urinalysis highly abnormal but culture negative-given presentation would continue to treat as it has UTI  Atrial fibrillation with RVR -likely driven by dehydration and stress from sepsis - initially required Cardizem drip for rate control but this was weaned off 12/11/2013 but unfortunately despite having converted to sinus rhythm atrial fibrillation with RVR resumed with rates in the 140s therefore Cardizem infusion restarted -Blood pressure soft so reluctant to pursue scheduled beta blocker per tube  Diabetes mellitus type 2, uncontrolled -contlow-dose Lantus + SSI-adjust as indicated based on hyperglycemia or hypoglycemia -Follow CBG every 4 hours but watch closely since currently holding tube feeding  Hypertension -Blood pressure soft so antihypertensive medications on hold  Dehydration/Hypernatremia -Continue IV fluids at 50 cc per hour - has known ejection fraction of 35 % to 40% -Sodium has increased despite addition of free water on 12/11/2013 so change maintenance fluids to dextrose  Chronic systolic heart failure -Compensated / DH presently   Dysphagia/PEG (percutaneous endoscopic gastrostomy) status -Hypoactive bowel sounds in the setting of acute sepsis therefore tube feedings on hold  History of significant ischemic stroke with residual hemiparesis -Prognosis poor -Patient niece at bedside and discussed via telephone with patient's sister prognosis -Current plan is to continue treating any reversible causes of infection and monitor for improvement noting if no improvement likely will progress to comfort care  DVT prophylaxis:  SCDs Code Status: DO NOT RESUSCITATE Family Communication: No family at bedside Disposition Plan/Expected LOS: SDU  Consultants: Neurology   Procedures: None  Antibiotics: Zosyn 2/17 >>>  Vancomycin 2/17 >>> Rocephin 2/18 >>> 2/19  HPI/Subjective: Unresponsive. Spoke to patient's niece at the bedside.  Objective: Blood pressure 132/99, pulse 148, temperature 98.4 F (36.9 C), temperature source Axillary, resp. rate 19, height 6' (1.829 m), weight 262 lb 12.6 oz (119.2 kg), SpO2 92.00%.  Intake/Output Summary (Last 24 hours) at 12/12/13 1430 Last data filed at 12/12/13 1351  Gross per 24 hour  Intake   1380 ml  Output   2075 ml  Net   -695 ml    Exam: Followup exam completed. Patient admitted at 1:04 AM today  Scheduled Meds:  Scheduled Meds: . cefTRIAXone (ROCEPHIN)  IV  1 g Intravenous Q24H  . fluconazole (DIFLUCAN) IV  100 mg Intravenous Q24H  . free water  200 mL Per Tube Q6H  . insulin aspart  0-15 Units Subcutaneous 6 times per day  . insulin glargine  10 Units Subcutaneous Daily  . nystatin  5 mL Oral QID   Data Reviewed: Basic Metabolic Panel:  Recent Labs Lab 12/10/13 2112 12/10/13 2121 12/11/13 0451 12/12/13 0228  NA 144 146 148* 151*  K 4.8 4.6 4.4 4.5  CL 106 109 113* 115*  CO2 22  --  20 24  GLUCOSE 534* 565* 462* 253*  BUN 42* 40* 39* 31*  CREATININE 0.88 1.00 0.83 0.82  CALCIUM 9.8  --  9.0 8.9   Liver Function Tests:  Recent Labs Lab 12/10/13 2112  AST 41*  ALT 49  ALKPHOS 191*  BILITOT 0.4  PROT 7.9  ALBUMIN 3.1*   CBC:  Recent Labs Lab 12/10/13 2112 12/10/13 2121 12/11/13 0451  WBC 10.2  --  8.9  NEUTROABS 5.9  --   --   HGB 15.0 16.3 15.1  HCT 45.9 48.0 46.5  MCV 78.7  --  79.2  PLT 173  --  125*   CBG:  Recent Labs Lab 12/11/13 1939 12/11/13 2322 12/12/13 0429 12/12/13 0725 12/12/13 1143  GLUCAP 237* 211* 219* 246* 302*    Recent Results (from the past 240 hour(s))  CULTURE, BLOOD (ROUTINE X 2)      Status: None   Collection Time    12/10/13  9:40 PM      Result Value Ref Range Status   Specimen Description BLOOD RIGHT ARM   Final   Special Requests BOTTLES DRAWN AEROBIC AND ANAEROBIC 10ML   Final   Culture  Setup Time     Final   Value: 12/11/2013 03:27     Performed at [facility]   Culture     Final   Value:        BLOOD CULTURE RECEIVED NO GROWTH TO DATE CULTURE WILL BE HELD FOR 5 DAYS BEFORE ISSUING A FINAL NEGATIVE REPORT     Performed at [facility]   Report Status PENDING   Incomplete  CULTURE, BLOOD (ROUTINE X 2)     Status: None   Collection Time    12/10/13  9:45 PM      Result Value Ref Range Status   Specimen Description BLOOD RIGHT HAND   Final   Special Requests BOTTLES DRAWN AEROBIC ONLY 10CC   Final   Culture  Setup Time     Final   Value: 12/11/2013 03:40     Performed at [facility]   Culture     Final   Value:        BLOOD CULTURE RECEIVED NO GROWTH TO DATE CULTURE WILL BE HELD FOR 5 DAYS  BEFORE ISSUING A FINAL NEGATIVE REPORT     Performed at [facility]   Report Status PENDING   Incomplete  URINE CULTURE     Status: None   Collection Time    12/10/13  9:52 PM      Result Value Ref Range Status   Specimen Description URINE, RANDOM   Final   Special Requests NONE   Final   Culture  Setup Time     Final   Value: 12/11/2013 03:43     Performed at [facility] Count     Final   Value: >=100,000 COLONIES/ML     Performed at [facility]   Culture     Final   Value: Multiple bacterial morphotypes present, none predominant. Suggest appropriate recollection if clinically indicated.     Performed at [facility]   Report Status 12/12/2013 FINAL   Final     Studies:  Recent x-ray studies have been reviewed in detail by the Attending Physician  Time spent : 25+ mins     Junious Silk, ANP [facility] Office  [phone] Pager [phone]  **If unable to reach the above  provider after paging please contact the Flow Manager @ [phone]  On-Call/Text Page:      [email or web address]      password [email or web address]  If 7PM-7AM, please contact night-coverage [email or web address] Password [email or web address] 12/12/2013, 2:30 PM   LOS: 2 days   Will place on broad spectrum abx's (Vanc and Zosyn) for sepsis 2ary to  HCAP vs UTI.  Will increase fluids via g tube and IV given persistent dehydration as evidenced by hypernatremia and elevated BUN creatinine ratio.  Discussed with nursing. Patient had elevated HR and was placed on Diltiazem gtt this morning will continue for now and try weaning today, this was also discussed with the nursing.  We will reassess next am.  Penny Pia
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
## 2013-12-12 NOTE — Progress Notes (Signed)
Physician notified: A. Rennis Harding, PA At: [id number]  Regarding: Bolus complete. HR 120-140s, BP 123/77. Want PEG coreg given? What maintenance IVF? None ordered.   Orders placed in eMAR, RN carried out.

## 2013-12-12 NOTE — Progress Notes (Signed)
Subjective: Mild improvement  Exam: Filed Vitals:   12/12/13 1958  BP: 113/64  Pulse: 59  Temp: 99 F (37.2 C)  Resp: 20   Gen: In bed, NAD MS: Awake, follows commands to make fist, close eyes. He has difficulty coordinating his speech and is not able to achieve output.  WS:FKCLE gaze deviation, left facial droop.  Motor: moves right arm and laeg well, no movement on left.  Sensory:grimaces to deep nox stim on left, mild stim on right.   Impression: 68 yo M with recent large right MCA infarct and worsened mental status. My suspicion is that his worsening is due to his infection/dehydration. IF the EEG is negative, then I would nto favor any further investigation other than treating his infection.   Recommendations: 1) EEG pending.  2) Will follow up EEG, if no epileptiform activity, neurolgoy will sign off. Please call with any further questions.  3) I think that his current deficits consistetn with a right MCA syndrome. are explained by his previous stroke.  Ritta Slot, MD [facility] [phone]  If 7pm- 7am, please page neurology on call at [phone].

## 2013-12-13 ENCOUNTER — Inpatient Hospital Stay (HOSPITAL_COMMUNITY): Payer: Medicare Other

## 2013-12-13 DIAGNOSIS — Z8673 Personal history of transient ischemic attack (TIA), and cerebral infarction without residual deficits: Secondary | ICD-10-CM

## 2013-12-13 DIAGNOSIS — R4182 Altered mental status, unspecified: Secondary | ICD-10-CM

## 2013-12-13 LAB — GLUCOSE, CAPILLARY
GLUCOSE-CAPILLARY: 263 mg/dL — AB (ref 70–99)
GLUCOSE-CAPILLARY: 164 mg/dL — AB (ref 70–99)
GLUCOSE-CAPILLARY: 144 mg/dL — AB (ref 70–99)
GLUCOSE-CAPILLARY: 149 mg/dL — AB (ref 70–99)
Glucose-Capillary: 219 mg/dL — ABNORMAL HIGH (ref 70–99)
Glucose-Capillary: 181 mg/dL — ABNORMAL HIGH (ref 70–99)
Glucose-Capillary: 174 mg/dL — ABNORMAL HIGH (ref 70–99)

## 2013-12-13 LAB — CBC
HCT: 37.9 % — ABNORMAL LOW (ref 39.0–52.0)
Hemoglobin: 11.8 g/dL — ABNORMAL LOW (ref 13.0–17.0)
MCH: 25.7 pg — ABNORMAL LOW (ref 26.0–34.0)
MCHC: 31.1 g/dL (ref 30.0–36.0)
MCV: 82.6 fL (ref 78.0–100.0)
PLATELETS: 89 10*3/uL — AB (ref 150–400)
RBC: 4.59 MIL/uL (ref 4.22–5.81)
RDW: 15.1 % (ref 11.5–15.5)
WBC: 9.7 10*3/uL (ref 4.0–10.5)

## 2013-12-13 LAB — BASIC METABOLIC PANEL
BUN: 20 mg/dL (ref 6–23)
CO2: 24 mEq/L (ref 19–32)
Calcium: 8.7 mg/dL (ref 8.4–10.5)
Chloride: 113 mEq/L — ABNORMAL HIGH (ref 96–112)
Creatinine, Ser: 0.76 mg/dL (ref 0.50–1.35)
GFR calc Af Amer: >90 mL/min (ref >90)
GFR calc non Af Amer: >90 mL/min (ref >90)
Glucose, Bld: 204 mg/dL — ABNORMAL HIGH (ref 70–99)
Potassium: 4 mEq/L (ref 3.7–5.3)
SODIUM: 149 meq/L — AB (ref 137–147)

## 2013-12-13 MED ORDER — INSULIN ASPART 100 UNIT/ML ~~LOC~~ SOLN
0.0000 [IU] | Freq: Four times a day (QID) | SUBCUTANEOUS | Status: DC
  Administered 2013-12-13 – 2013-12-14 (×4): 3 [IU] via SUBCUTANEOUS
  Administered 2013-12-15: 8 [IU] via SUBCUTANEOUS
  Administered 2013-12-15: 3 [IU] via SUBCUTANEOUS
  Administered 2013-12-15: 11 [IU] via SUBCUTANEOUS
  Administered 2013-12-15: 3 [IU] via SUBCUTANEOUS
  Administered 2013-12-16: 8 [IU] via SUBCUTANEOUS
  Administered 2013-12-16: 11 [IU] via SUBCUTANEOUS
  Administered 2013-12-16: 8 [IU] via SUBCUTANEOUS

## 2013-12-13 NOTE — Progress Notes (Signed)
EEG completed; results pending.    

## 2013-12-13 NOTE — Clinical Social Work Placement (Signed)
Clinical Social Work Department CLINICAL SOCIAL WORK PLACEMENT NOTE 12/13/2013  Patient:  LOUDON, KONDA  Account Number:  [id number] Admit date:  12/10/2013  Clinical Social Worker:  Cherre Blanc, [place]  Date/time:  12/13/2013 04:45 PM  Clinical Social Work is seeking post-discharge placement for this patient at the following level of care:   SKILLED NURSING   (*CSW will update this form in Epic as items are completed)   12/13/2013  Patient/family provided with [facility] Department of Clinical Social Work's list of facilities offering this level of care within the geographic area requested by the patient (or if unable, by the patient's family).  12/13/2013  Patient/family informed of their freedom to choose among providers that offer the needed level of care, that participate in Medicare, Medicaid or managed care program needed by the patient, have an available bed and are willing to accept the patient.  12/13/2013  Patient/family informed of [facility]' ownership interest in [facility], as well as of the fact that they are under no obligation to receive care at this facility.  PASARR submitted to EDS on  PASARR number received from EDS on   FL2 transmitted to all facilities in geographic area requested by pt/family on  12/13/2013 FL2 transmitted to all facilities within larger geographic area on   Patient informed that his/her managed care company has contracts with or will negotiate with  certain facilities, including the following:     Patient/family informed of bed offers received:   Patient chooses bed at  Physician recommends and patient chooses bed at    Patient to be transferred to  on   Patient to be transferred to facility by   The following physician request were entered in Epic:   Additional Comments:    Roddie Mc, [place], [place], [phone]

## 2013-12-13 NOTE — Progress Notes (Signed)
Pt's HR dropped to 30s-40s. Cardizem gtt stopped. NP notified. No new orders received. Will continue to monitor.

## 2013-12-13 NOTE — Progress Notes (Signed)
Inpatient Diabetes Program Recommendations  AACE/ADA: New Consensus Statement on Inpatient Glycemic Control (2013)  Target Ranges:  Prepandial:   less than 140 mg/dL      Peak postprandial:   less than 180 mg/dL (1-2 hours)      Critically ill patients:  140 - 180 mg/dL   Results for Todd Vargas, Todd Vargas (MRN [id number]) as of 12/13/2013 12:03  Ref. Range 12/12/2013 04:29 12/12/2013 07:25 12/12/2013 11:43 12/12/2013 16:27 12/12/2013 19:58 12/12/2013 23:33 12/13/2013 03:16 12/13/2013 07:25  Glucose-Capillary Latest Range: 70-99 mg/dL 742 (H) 595 (H) 638 (H) 266 (H) 263 (H) 219 (H) 181 (H) 174 (H)   Diabetes history: DM2 Outpatient Diabetes medications: None Current orders for Inpatient glycemic control: Lantus 10 units daily, Novolog 0-15 units Q4H  Inpatient Diabetes Program Recommendations Insulin - Basal: Please consider increasing Lantus to 15 units daily.  Note: Blood glucose ranged from 219-302 on 12/12/13 and 174-181 so far today. Patient received a total of Novolog 42 units for glucose correction on 12/12/13 in addition to Lantus 10 units. Please consider increasing Lantus to 15 units daily to improve inpatient glycemic control.   Thanks, Orlando Penner, RN, MSN, CCRN Diabetes Coordinator Inpatient Diabetes Program [phone] (Team Pager) [phone] (AP office) [phone] [facility] office)

## 2013-12-13 NOTE — Clinical Social Work Psychosocial (Signed)
Clinical Social Work Department BRIEF PSYCHOSOCIAL ASSESSMENT 12/13/2013  Patient:  Todd Vargas, Todd Vargas     Account Number:  [id number]     Admit date:  12/10/2013  Clinical Social Worker:  Lavell Luster  Date/Time:  12/13/2013 04:30 PM  Referred by:  Physician  Date Referred:  12/13/2013 Referred for  SNF Placement   Other Referral:   Interview type:  Family Other interview type:   Patient not oriented at time of assessment. No family at bedside. CSW interviewed patient's neice Todd Vargas by phone.    PSYCHOSOCIAL DATA Living Status:  WIFE Admitted from facility:  [facility] Level of care:  Skilled Nursing Facility Primary support name:  Todd Vargas Primary support relationship to patient:  FAMILY Degree of support available:   Support is adequate.    CURRENT CONCERNS Current Concerns  Post-Acute Placement   Other Concerns:    SOCIAL WORK ASSESSMENT / PLAN CSW spoke with patient's neice Todd Vargas by phone to complete assessment. Todd Vargas confirms that patient was admitted from [facility]. She states that patient was at facility for about a week before returning to the hospital. CSW inquired about family's happiness with facility. Neice states that the family thought the facility was "ok" but they felt like the facility was not that "secure". Todd Vargas would like for patient to possibly go to a different facility at discharge. Todd Vargas states that patient has a wife but the wife defers to the neices to help her make decisions, but neices do not make decisions about patient without the wife's input. Todd Vargas states that she will be at hospital on Sunday to visit patient. CSW explained SNF search process and answered Todd Vargas's questions.   Assessment/plan status:  Psychosocial Support/Ongoing Assessment of Needs Other assessment/ plan:   Complete FL2, Fax   Information/referral to community resources:   CSW contact information and SNF list given  to patient.    PATIENT'S/FAMILY'S RESPONSE TO PLAN OF CARE: Patient's neice Todd Vargas would like to see what other SNF options are available for patient at DC. Todd Vargas was pleasant, appropriate, and appreciative of CSW contact. Family waits for bed offers.       Todd Vargas, [place], [place], [phone]

## 2013-12-13 NOTE — Progress Notes (Addendum)
[name]  Bea LauraBen D Grewell ZOX:[id number] DOB: 12/23/1945 DOA: 12/10/2013 PCP: Thayer HeadingsMACKENZIE,BRIAN, MD  Brief narrative: 68 year old male patient recently admitted on 11/18/2013 after suffering a massive right MCA ischemic stroke due to previously undiagnosed atrial fibrillation. That hospital course was complicated by subfalcine herniation as well as significant cerebral edema that required hypertonic saline. Prior to discharge patient had a baseline of severe left-sided hemi-neglect with other neurological deficits and was requiring a PEG tube for medications and feedings. Apparently at time of discharge he was awake and able to follow commands with the right side. Unfortunately over the past several days at the nursing home the patient has developed altered mentation and was minimally responsive and therefore sent to the emergency department. In the ER he was found to have a fever of 101.3, he had tachypnea, urinalysis consistent with UTI. He also had hyperglycemia without ketoacidosis and appeared to be severely dehydrated.  Assessment/Plan:  Sepsis -Initially suspected to be related to urinary tract infection and although urinalysis highly abnormal culture was negative but still consider this as possible etiology. Since arrival and after improving hydration patient now with thick yellow brown secretions suctioned orally. Consider HCAP vs aspiration pneumonia -Continue empiric antibiotics and other supportive care -Followup on all cultures -DO NOT RESUSCITATE and after discussion with family will not escalate care but we'll continue current treatment efforts -d/w niece may need to re evaluate GOC prior to dc ie complete MOST form  Acute hypoxemic respiratory failure / ?HCAP vs asp PNA -02 requirements have increased since admission and now with thick abnormal-appearing sputum -Continue supportive care and empiric antibiotic-broaden coverage -DO NOT RESUSCITATE  - do not escalate care -Per RN patient does not have any significant gastric residuals when checked  Metabolic encephalopathy -Likely related to sepsis and infectious processes -EEG and hemispheric dysfunction which correlates with patient's known history of ischemic stroke. No seizures. -Hold MRI and consider pursuing again if patient stabilizes  UTI  -Urinalysis highly abnormal but culture negative-given presentation would continue to treat as it has UTI  Atrial fibrillation with RVR -likely driven by dehydration and stress from sepsis - initially required Cardizem drip for rate control but this was weaned off 12/11/2013 but unfortunately despite having converted to sinus rhythm atrial fibrillation with RVR resumed with rates in the 140s therefore Cardizem infusion restarted the patient had bradycardia with rates in the 30s overnight into 12/13/2013 to Cardizem infusion stopped -We'll accept heart rate less than or equal to 110 at this point -Blood pressure soft so reluctant to pursue scheduled beta blocker per tube  Diabetes mellitus type 2, uncontrolled -contlow-dose Lantus + SSI-adjust as indicated based on hyperglycemia or hypoglycemia -Follow CBG every 4 hours but watch closely since currently holding tube feeding  Hypertension -Blood pressure soft so antihypertensive medications on hold  Dehydration/Hypernatremia -Continue dextrose IV fluids cautiously- has known ejection fraction of 35 % to 40%  Chronic systolic heart failure -Compensated / DH presently   Dysphagia/PEG (percutaneous endoscopic gastrostomy) status -Hypoactive bowel sounds in the setting of acute sepsis therefore tube feedings on hold  History of significant ischemic stroke with residual hemiparesis -Prognosis poor -Discussed via telephone with patient's sister prognosis -Current plan is to continue treating any reversible causes of infection and monitor for improvement noting if no improvement likely will  progress to comfort care  DVT prophylaxis: SCDs Code Status: DO NOT RESUSCITATE Family Communication: Spoke with niece Basilia JumboLisa Mills by telephone [phone]) Disposition Plan/Expected  LOS: SDU  Consultants: Neurology   Procedures: None  Antibiotics: Zosyn 2/17 >>> Vancomycin 2/17 >>> Rocephin 2/18 >>> 2/19  HPI/Subjective: Unresponsive.   Objective: Blood pressure 110/61, pulse 87, temperature 98.4 F (36.9 C), temperature source Oral, resp. rate 13, height 6' (1.829 m), weight 262 lb 12.6 oz (119.2 kg), SpO2 96.00%.  Intake/Output Summary (Last 24 hours) at 12/13/13 1553 Last data filed at 12/13/13 1300  Gross per 24 hour  Intake 1676.15 ml  Output   1225 ml  Net 451.15 ml   Exam: General: No acute respiratory distress Lungs: Clear to auscultation bilaterally without wheezes or crackles, RA Cardiovascular: Regular rate and rhythm without murmur gallop or rub normal S1 and S2, no peripheral edema or JVD Abdomen: Nontender, nondistended, soft, bowel sounds positive, no rebound, no ascites, no appreciable mass Musculoskeletal: No significant cyanosis, clubbing of bilateral lower extremities Neurological: obtunded  Scheduled Meds:  Scheduled Meds: . fluconazole (DIFLUCAN) IV  100 mg Intravenous Q24H  . free water  300 mL Per Tube Q6H  . insulin aspart  0-15 Units Subcutaneous 6 times per day  . insulin glargine  10 Units Subcutaneous Daily  . nystatin  5 mL Oral QID  . piperacillin-tazobactam (ZOSYN)  IV  3.375 g Intravenous 3 times per day  . vancomycin (VANCOCIN) 1250 mg IVPB  1,250 mg Intravenous Q8H   Data Reviewed: Basic Metabolic Panel:  Recent Labs Lab 12/10/13 2112 12/10/13 2121 12/11/13 0451 12/12/13 0228 12/13/13 0332  NA 144 146 148* 151* 149*  K 4.8 4.6 4.4 4.5 4.0  CL 106 109 113* 115* 113*  CO2 22  --  20 24 24   GLUCOSE 534* 565* 462* 253* 204*  BUN 42* 40* 39* 31* 20  CREATININE 0.88 1.00 0.83 0.82 0.76  CALCIUM 9.8  --  9.0 8.9 8.7   Liver  Function Tests:  Recent Labs Lab 12/10/13 2112  AST 41*  ALT 49  ALKPHOS 191*  BILITOT 0.4  PROT 7.9  ALBUMIN 3.1*   CBC:  Recent Labs Lab 12/10/13 2112 12/10/13 2121 12/11/13 0451 12/13/13 0332  WBC 10.2  --  8.9 9.7  NEUTROABS 5.9  --   --   --   HGB 15.0 16.3 15.1 11.8*  HCT 45.9 48.0 46.5 37.9*  MCV 78.7  --  79.2 82.6  PLT 173  --  125* 89*   CBG:  Recent Labs Lab 12/12/13 1958 12/12/13 2333 12/13/13 0316 12/13/13 0725 12/13/13 1239  GLUCAP 263* 219* 181* 174* 164*    Recent Results (from the past 240 hour(s))  CULTURE, BLOOD (ROUTINE X 2)     Status: None   Collection Time    12/10/13  9:40 PM      Result Value Ref Range Status   Specimen Description BLOOD RIGHT ARM   Final   Special Requests BOTTLES DRAWN AEROBIC AND ANAEROBIC   Final   Culture  Setup Time     Final   Value: 12/11/2013 03:27     Performed at [facility]   Culture     Final   Value:        BLOOD CULTURE RECEIVED NO GROWTH TO DATE CULTURE WILL BE HELD FOR 5 DAYS BEFORE ISSUING A FINAL NEGATIVE REPORT     Performed at [facility]   Report Status PENDING   Incomplete  CULTURE, BLOOD (ROUTINE X 2)     Status: None   Collection Time    12/10/13  9:45 PM  Result Value Ref Range Status   Specimen Description BLOOD RIGHT HAND   Final   Special Requests BOTTLES DRAWN AEROBIC ONLY 10CC   Final   Culture  Setup Time     Final   Value: 12/11/2013 03:40     Performed at [facility]   Culture     Final   Value: GRAM POSITIVE COCCI IN CLUSTERS     Note: Gram Stain Report Called to,Read Back By and Verified With: MORGAN RN AT [phone] BY CASTILLOC     Performed at [facility]   Report Status PENDING   Incomplete  URINE CULTURE     Status: None   Collection Time    12/10/13  9:52 PM      Result Value Ref Range Status   Specimen Description URINE, RANDOM   Final   Special Requests NONE   Final   Culture  Setup Time     Final   Value:  12/11/2013 03:43     Performed at [facility] Count     Final   Value: >=100,000 COLONIES/ML     Performed at [facility]   Culture     Final   Value: Multiple bacterial morphotypes present, none predominant. Suggest appropriate recollection if clinically indicated.     Performed at [facility]   Report Status 12/12/2013 FINAL   Final     Studies:  Recent x-ray studies have been reviewed in detail by the Attending Physician  Time spent : 35 mins     Junious Silk, ANP [facility] Office  [phone] Pager [phone]  **If unable to reach the above provider after paging please contact the Flow Manager @ [phone]  On-Call/Text Page:      [email or web address]      password [email or web address]  If 7PM-7AM, please contact night-coverage [email or web address] Password [email or web address] 12/13/2013, 3:53 PM   LOS: 3 days   I have personally examined this patient and reviewed the entire database. I have reviewed the above note, made any necessary editorial changes, and agree with its content.  Lonia Blood, MD [facility]

## 2013-12-13 NOTE — Procedures (Signed)
History: 68 yo M With previous MCA infarct and worsened MS in teh setting of infection.   Technique: This is a 17 channel routine scalp EEG performed at the bedside with bipolar and monopolar montages arranged in accordance to the international 10/20 system of electrode placement. One channel was dedicated to EKG recording.    Background: There is a posterior dominant rhythm of 9 Hz that is better seen on the right than left. The rigth hemisphere has irregular delta activity and a pucity of faster frequencies. Sleep structures are seen, also better on the left than right.   Photic stimulation: Physiologic driving is not performed  EEG Abnormalities: 1) Irregular right sided delta activity 2) attenuation of faster frequencies on the right.   Clinical Interpretation: This  EEG is consistent with a right hemispheric dysfunction consistent with the patient's known history of ischemic stroke. There was no seizure or seizure predisposition recorded on this study.   Ritta Slot, MD [facility] [phone]  If 7pm- 7am, please page neurology on call at [phone].

## 2013-12-14 LAB — GLUCOSE, CAPILLARY
GLUCOSE-CAPILLARY: 186 mg/dL — AB (ref 70–99)
Glucose-Capillary: 145 mg/dL — ABNORMAL HIGH (ref 70–99)
Glucose-Capillary: 155 mg/dL — ABNORMAL HIGH (ref 70–99)
Glucose-Capillary: 159 mg/dL — ABNORMAL HIGH (ref 70–99)
Glucose-Capillary: 165 mg/dL — ABNORMAL HIGH (ref 70–99)
Glucose-Capillary: 174 mg/dL — ABNORMAL HIGH (ref 70–99)
Glucose-Capillary: 191 mg/dL — ABNORMAL HIGH (ref 70–99)

## 2013-12-14 LAB — BASIC METABOLIC PANEL
BUN: 13 mg/dL (ref 6–23)
CHLORIDE: 108 meq/L (ref 96–112)
CO2: 22 mEq/L (ref 19–32)
CREATININE: 0.7 mg/dL (ref 0.50–1.35)
Calcium: 8.5 mg/dL (ref 8.4–10.5)
GFR calc non Af Amer: >90 mL/min (ref >90)
Glucose, Bld: 193 mg/dL — ABNORMAL HIGH (ref 70–99)
POTASSIUM: 3.6 meq/L — AB (ref 3.7–5.3)
Sodium: 143 mEq/L (ref 137–147)

## 2013-12-14 LAB — CULTURE, BLOOD (ROUTINE X 2)

## 2013-12-14 LAB — CBC
HCT: 37.4 % — ABNORMAL LOW (ref 39.0–52.0)
Hemoglobin: 12 g/dL — ABNORMAL LOW (ref 13.0–17.0)
MCH: 25.8 pg — ABNORMAL LOW (ref 26.0–34.0)
MCHC: 32.1 g/dL (ref 30.0–36.0)
MCV: 80.4 fL (ref 78.0–100.0)
PLATELETS: 88 10*3/uL — AB (ref 150–400)
RBC: 4.65 MIL/uL (ref 4.22–5.81)
RDW: 14.6 % (ref 11.5–15.5)
WBC: 7.9 10*3/uL (ref 4.0–10.5)

## 2013-12-14 MED ORDER — RIVAROXABAN 20 MG PO TABS
20.0000 mg | ORAL_TABLET | Freq: Every day | ORAL | Status: DC
  Administered 2013-12-14 – 2013-12-16 (×3): 20 mg
  Filled 2013-12-14 (×3): qty 1

## 2013-12-14 MED ORDER — OSMOLITE 1.5 CAL PO LIQD
1000.0000 mL | ORAL | Status: DC
  Administered 2013-12-14 – 2013-12-15 (×3): 1000 mL
  Filled 2013-12-14 (×5): qty 1000

## 2013-12-14 MED ORDER — FREE WATER
200.0000 mL | Freq: Four times a day (QID) | Status: DC
  Administered 2013-12-14 – 2013-12-16 (×8): 200 mL

## 2013-12-14 MED ORDER — METOPROLOL TARTRATE 25 MG/10 ML ORAL SUSPENSION
25.0000 mg | Freq: Two times a day (BID) | ORAL | Status: DC
  Administered 2013-12-14 – 2013-12-15 (×2): 25 mg
  Filled 2013-12-14 (×3): qty 10

## 2013-12-14 MED ORDER — DILTIAZEM 12 MG/ML ORAL SUSPENSION
30.0000 mg | Freq: Four times a day (QID) | ORAL | Status: DC
  Administered 2013-12-14 – 2013-12-19 (×19): 30 mg
  Filled 2013-12-14 (×23): qty 3

## 2013-12-14 NOTE — Progress Notes (Signed)
[name]  EVENS MENO TTS:[id number] DOB: 14-Aug-1946 DOA: 12/10/2013 PCP: Thressa Sheller, MD  Brief narrative: 68 year old male patient recently admitted on 11/18/2013 after suffering a massive right MCA ischemic stroke due to previously undiagnosed atrial fibrillation. That hospital course was complicated by subfalcine herniation as well as significant cerebral edema that required hypertonic saline. Prior to discharge patient had a baseline of severe left-sided hemi-neglect with other neurological deficits and was requiring a PEG tube for medications and feedings. Apparently at time of discharge he was awake and able to follow commands with the right side. Unfortunately over the past several days at the nursing home the patient has developed altered mentation and was minimally responsive and therefore was sent to the emergency department. In the ER he was found to have a fever of 101.3, he had tachypnea, urinalysis consistent with UTI. He also had hyperglycemia without ketoacidosis and appeared to be severely dehydrated.  Assessment/Plan:  Sepsis - multiple sources  -related to urinary tract infection + aspiration v/s healthcare acquired PNA -DO NOT RESUSCITATE and after discussion with family will not escalate care but we'll continue current treatment efforts -narrow abx w/ d/c of vanc today   Acute hypoxemic respiratory failure / R perihilar and L basilar HCAP vs asp PNA -after hydration patient develped thick yellow brown respiratory secretions suggesting HCAP vs aspiration pneumonia -02 requirements increased after admit, but have again improved  -Continue supportive care and empiric antibiotic coverage -No significant gastric residuals when checked but aspiration of secretions remains possible   UTI  -Urinalysis highly abnormal - culture was not able to isolate a single offending organism - empiric abx continue   Metabolic  encephalopathy -Likely related to sepsis and infectious processes -EEG revea;ed hemispheric dysfunction which correlates with patient's known history of ischemic stroke - no seizures noted  -Hold MRI and consider pursuing again if patient stabilizes  Atrial fibrillation with RVR -likely driven by dehydration and stress from sepsis -attempts to tx w/ IV cardizem were met w/ bradycardia -accept heart rate less than or equal to 110 at this point -Blood pressure stabilizing - add BB per tube and follow   Diabetes mellitus type 2, uncontrolled -cont low dose Lantus + SSI -Follow CBG   Hypertension -well controlled at present   Dehydration/Hypernatremia -now resolved   Chronic systolic heart failure -Compensated - EF 35-40% via TTE 11/20/2013  Dysphagia / PEG dependent -adom exam benign and no signif residual w/ free water - will resume tube feeds and follow   History of significant ischemic stroke with residual hemiparesis -long term prognosis poor -Current plan is to continue treating any reversible causes of infection and monitor for improvement noting if no improvement likely will progress to comfort care  DVT prophylaxis: SCDs Code Status: DO NOT RESUSCITATE Family Communication: Spoke with niece Marylou Flesher by telephone [phone]) again today  Disposition Plan/Expected LOS: SDU  Consultants: Neurology   Procedures: None  Antibiotics: Zosyn 2/17 >>> Vancomycin 2/17 >>>2/21 Rocephin 2/18 >>> 2/19  HPI/Subjective: Unresponsive.   Objective: Blood pressure 118/66, pulse 107, temperature 97.7 F (36.5 C), temperature source Oral, resp. rate 18, height 6' (1.829 m), weight 119.2 kg (262 lb 12.6 oz), SpO2 98.00%.  Intake/Output Summary (Last 24 hours) at 12/14/13 1434 Last data filed at 12/14/13 1400  Gross per 24 hour  Intake   2600 ml  Output   1502 ml  Net   1098 ml   Exam: General: No  acute respiratory distress Lungs: Clear to auscultation bilaterally  without wheezes or crackles, RA Cardiovascular: Regular rate without murmur gallop or rub normal S1 and S2, no peripheral edema or JVD Abdomen: Nontender, nondistended, soft, bowel sounds positive, no rebound, no ascites, no appreciable mass Musculoskeletal: No significant cyanosis, clubbing of bilateral lower extremities Neurological: unresponsive on exam - will not follow commands - does not open eyes to exam  Scheduled Meds:  Scheduled Meds: . free water  300 mL Per Tube Q6H  . insulin aspart  0-15 Units Subcutaneous 4 times per day  . insulin glargine  10 Units Subcutaneous Daily  . nystatin  5 mL Oral QID  . piperacillin-tazobactam (ZOSYN)  IV  3.375 g Intravenous 3 times per day   Data Reviewed: Basic Metabolic Panel:  Recent Labs Lab 12/10/13 2112 12/10/13 2121 12/11/13 0451 12/12/13 0228 12/13/13 0332 12/14/13 0307  NA 144 146 148* 151* 149* 143  K 4.8 4.6 4.4 4.5 4.0 3.6*  CL 106 109 113* 115* 113* 108  CO2 22  --  20 24 24 22   GLUCOSE 534* 565* 462* 253* 204* 193*  BUN 42* 40* 39* 31* 20 13  CREATININE 0.88 1.00 0.83 0.82 0.76 0.70  CALCIUM 9.8  --  9.0 8.9 8.7 8.5   Liver Function Tests:  Recent Labs Lab 12/10/13 2112  AST 41*  ALT 49  ALKPHOS 191*  BILITOT 0.4  PROT 7.9  ALBUMIN 3.1*   CBC:  Recent Labs Lab 12/10/13 2112 12/10/13 2121 12/11/13 0451 12/13/13 0332 12/14/13 0307  WBC 10.2  --  8.9 9.7 7.9  NEUTROABS 5.9  --   --   --   --   HGB 15.0 16.3 15.1 11.8* 12.0*  HCT 45.9 48.0 46.5 37.9* 37.4*  MCV 78.7  --  79.2 82.6 80.4  PLT 173  --  125* 89* 88*   CBG:  Recent Labs Lab 12/13/13 1918 12/13/13 2330 12/14/13 0534 12/14/13 0721 12/14/13 1228  GLUCAP 149* 159* 191* 145* 155*    Recent Results (from the past 240 hour(s))  CULTURE, BLOOD (ROUTINE X 2)     Status: None   Collection Time    12/10/13  9:40 PM      Result Value Ref Range Status   Specimen Description BLOOD RIGHT ARM   Final   Special Requests BOTTLES DRAWN  AEROBIC AND ANAEROBIC 10ML   Final   Culture  Setup Time     Final   Value: 12/11/2013 03:27     Performed at [facility]   Culture     Final   Value:        BLOOD CULTURE RECEIVED NO GROWTH TO DATE CULTURE WILL BE HELD FOR 5 DAYS BEFORE ISSUING A FINAL NEGATIVE REPORT     Performed at [facility]   Report Status PENDING   Incomplete  CULTURE, BLOOD (ROUTINE X 2)     Status: None   Collection Time    12/10/13  9:45 PM      Result Value Ref Range Status   Specimen Description BLOOD RIGHT HAND   Final   Special Requests BOTTLES DRAWN AEROBIC ONLY 10CC   Final   Culture  Setup Time     Final   Value: 12/11/2013 03:40     Performed at [facility]   Culture     Final   Value: STAPHYLOCOCCUS SPECIES (COAGULASE NEGATIVE)     Note: THE SIGNIFICANCE OF ISOLATING THIS ORGANISM FROM A  SINGLE SET OF BLOOD CULTURES WHEN MULTIPLE SETS ARE DRAWN IS UNCERTAIN. PLEASE NOTIFY THE MICROBIOLOGY DEPARTMENT WITHIN ONE WEEK IF SPECIATION AND SENSITIVITIES ARE REQUIRED.     Note: Gram Stain Report Called to,Read Back By and Verified With: [facility] RN AT [phone] BY CASTILLOC     Performed at [facility]   Report Status 12/14/2013 FINAL   Final  URINE CULTURE     Status: None   Collection Time    12/10/13  9:52 PM      Result Value Ref Range Status   Specimen Description URINE, RANDOM   Final   Special Requests NONE   Final   Culture  Setup Time     Final   Value: 12/11/2013 03:43     Performed at [place]     Final   Value: >=100,000 COLONIES/ML     Performed at [facility]   Culture     Final   Value: Multiple bacterial morphotypes present, none predominant. Suggest appropriate recollection if clinically indicated.     Performed at [facility]   Report Status 12/12/2013 FINAL   Final     Studies:  Recent x-ray studies have been reviewed in detail by the Attending Physician  Time spent : 55 mins  Cherene Altes, MD [facility] For Consults/Admissions - Flow Manager - [phone] Office  [phone] Pager [phone]  On-Call/Text Page:      [email or web address]      password [facility]  12/14/2013, 2:34 PM   LOS: 4 days

## 2013-12-15 ENCOUNTER — Inpatient Hospital Stay (HOSPITAL_COMMUNITY): Payer: Medicare Other

## 2013-12-15 LAB — GLUCOSE, CAPILLARY
GLUCOSE-CAPILLARY: 346 mg/dL — AB (ref 70–99)
Glucose-Capillary: 184 mg/dL — ABNORMAL HIGH (ref 70–99)
Glucose-Capillary: 204 mg/dL — ABNORMAL HIGH (ref 70–99)
Glucose-Capillary: 285 mg/dL — ABNORMAL HIGH (ref 70–99)
Glucose-Capillary: 304 mg/dL — ABNORMAL HIGH (ref 70–99)

## 2013-12-15 MED ORDER — METOPROLOL TARTRATE 25 MG/10 ML ORAL SUSPENSION
50.0000 mg | Freq: Two times a day (BID) | ORAL | Status: DC
  Administered 2013-12-15 – 2013-12-17 (×5): 50 mg
  Filled 2013-12-15 (×8): qty 20

## 2013-12-15 MED ORDER — INSULIN GLARGINE 100 UNIT/ML ~~LOC~~ SOLN
18.0000 [IU] | Freq: Every day | SUBCUTANEOUS | Status: DC
  Administered 2013-12-16: 18 [IU] via SUBCUTANEOUS
  Filled 2013-12-15 (×2): qty 0.18

## 2013-12-15 NOTE — Progress Notes (Signed)
Notified Dr. Sharon Seller via text page of pt's CBG ([phone]). Renette Butters, Viona Gilmore

## 2013-12-15 NOTE — Progress Notes (Signed)
Todd Vargas  Todd Vargas DOB: April 18, 1946 DOA: 12/10/2013 PCP: Thressa Sheller, MD  Brief narrative: 68 year old male patient admitted on 11/18/2013 after suffering a massive right MCA ischemic stroke due to previously undiagnosed atrial fibrillation. That hospital course was complicated by subfalcine herniation as well as significant cerebral edema that required hypertonic saline. Prior to discharge patient had a baseline of severe left-sided hemi-neglect with other neurological deficits and was requiring a PEG tube for medications and feedings. Apparently at time of discharge he was awake and able to follow commands with the right side. Unfortunately over the preceding several days at the nursing home the patient developed altered mentation and was minimally responsive and therefore was sent to the emergency department. In the ER he was found to have a fever of 101.3, he had tachypnea, urinalysis consistent with UTI. He also had hyperglycemia without ketoacidosis and appeared to be severely dehydrated.  HPI/Subjective: Remains obtunded today.    Assessment/Plan:  Sepsis - multiple sources  -related to urinary tract infection + aspiration v/s healthcare acquired PNA -DO NOT RESUSCITATE and after discussion with family will not escalate care but we'll continue current treatment efforts -narrow abx w/ d/c of vanc   Acute hypoxemic respiratory failure / R perihilar and L basilar HCAP vs asp PNA -after hydration patient develped thick yellow brown respiratory secretions suggesting HCAP vs aspiration pneumonia -02 requirements increased after admit, but have again improved  -Continue supportive care and empiric antibiotic coverage -No significant gastric residuals when checked but aspiration of secretions remains possible   UTI  -Urinalysis highly abnormal - culture was not able to isolate a single offending organism - empiric abx continue    Metabolic encephalopathy -Likely related to sepsis and infectious processes, in setting of recent devastating CVA -EEG revealed hemispheric dysfunction which correlates with patient's known history of ischemic stroke - no seizures noted  -will consider MRI for prognostic value to aid family in goals of care if they are undecided after discussion Monday   Atrial fibrillation with RVR -likely driven by dehydration and stress from sepsis -attempts to tx w/ IV cardizem were met w/ bradycardia -accept heart rate less than or equal to 110 at this point -Blood pressure stabilizing - BB per tube - follow   Diabetes mellitus type 2, uncontrolled -CBG climbing - adjust tx and follow   Hypertension -well controlled at present   Dehydration/Hypernatremia -now resolved   Chronic systolic heart failure -Compensated - EF 35-40% via TTE 11/20/2013  Dysphagia / PEG dependent -adom exam benign and no signif residual w/ tube feeds   History of significant ischemic stroke with residual hemiparesis -long term prognosis poor -Current plan is to continue treating any reversible causes of infection and monitor for improvement noting if no improvement likely will progress to comfort care  DVT prophylaxis: SCDs Code Status: DO NOT RESUSCITATE Family Communication: no family present at time of exam today  Disposition Plan/Expected LOS: SDU - will discuss long term plans with family tomorrow as pt is not progressing from mental status standpoint   Consultants: Neurology   Procedures: None  Antibiotics: Zosyn 2/17 >>> Vancomycin 2/17 >>>2/21 Rocephin 2/18 >>> 2/19  Objective: Blood pressure 135/65, pulse 112, temperature 98.7 F (37.1 C), temperature source Oral, resp. rate 30, height 6' (1.829 m), weight 119.2 kg (262 lb 12.6 oz), SpO2 94.00%.  Intake/Output Summary (Last 24 hours) at 12/15/13 1422 Last data filed at 12/15/13 1408  Gross per  24 hour  Intake 1752.5 ml  Output   1525 ml   Net  227.5 ml   Exam: General: No acute respiratory distress Lungs: Clear to auscultation bilaterally without wheezes or crackles, RA Cardiovascular: Regular rate without murmur gallop or rub normal S1 and S2, no peripheral edema or JVD Abdomen: Nontender, nondistended, soft, bowel sounds positive, no rebound, no ascites, no appreciable mass Musculoskeletal: No significant cyanosis, clubbing of bilateral lower extremities Neurological: unresponsive on exam - does not open eyes to exam  Scheduled Meds:  Scheduled Meds: . diltiazem  30 mg Per Tube 4 times per day  . feeding supplement (OSMOLITE 1.5 CAL)  1,000 mL Per Tube Q24H  . free water  200 mL Per Tube Q6H  . insulin aspart  0-15 Units Subcutaneous 4 times per day  . insulin glargine  10 Units Subcutaneous Daily  . metoprolol tartrate  25 mg Per Tube BID  . nystatin  5 mL Oral QID  . piperacillin-tazobactam (ZOSYN)  IV  3.375 g Intravenous 3 times per day  . Rivaroxaban  20 mg Per Tube Q supper   Data Reviewed: Basic Metabolic Panel:  Recent Labs Lab 12/10/13 2112 12/10/13 2121 12/11/13 0451 12/12/13 0228 12/13/13 0332 12/14/13 0307  NA 144 146 148* 151* 149* 143  K 4.8 4.6 4.4 4.5 4.0 3.6*  CL 106 109 113* 115* 113* 108  CO2 22  --  20 24 24 22   GLUCOSE 534* 565* 462* 253* 204* 193*  BUN 42* 40* 39* 31* 20 13  CREATININE 0.88 1.00 0.83 0.82 0.76 0.70  CALCIUM 9.8  --  9.0 8.9 8.7 8.5   Liver Function Tests:  Recent Labs Lab 12/10/13 2112  AST 41*  ALT 49  ALKPHOS 191*  BILITOT 0.4  PROT 7.9  ALBUMIN 3.1*   CBC:  Recent Labs Lab 12/10/13 2112 12/10/13 2121 12/11/13 0451 12/13/13 0332 12/14/13 0307  WBC 10.2  --  8.9 9.7 7.9  NEUTROABS 5.9  --   --   --   --   HGB 15.0 16.3 15.1 11.8* 12.0*  HCT 45.9 48.0 46.5 37.9* 37.4*  MCV 78.7  --  79.2 82.6 80.4  PLT 173  --  125* 89* 88*   CBG:  Recent Labs Lab 12/14/13 1716 12/14/13 2258 12/15/13 0618 12/15/13 0725 12/15/13 1200  GLUCAP 174*  186* 184* 204* 285*    Recent Results (from the past 240 hour(s))  CULTURE, BLOOD (ROUTINE X 2)     Status: None   Collection Time    12/10/13  9:40 PM      Result Value Ref Range Status   Specimen Description BLOOD RIGHT ARM   Final   Special Requests BOTTLES DRAWN AEROBIC AND ANAEROBIC 10ML   Final   Culture  Setup Time     Final   Value: 12/11/2013 03:27     Performed at [facility]   Culture     Final   Value:        BLOOD CULTURE RECEIVED NO GROWTH TO DATE CULTURE WILL BE HELD FOR 5 DAYS BEFORE ISSUING A FINAL NEGATIVE REPORT     Performed at [facility]   Report Status PENDING   Incomplete  CULTURE, BLOOD (ROUTINE X 2)     Status: None   Collection Time    12/10/13  9:45 PM      Result Value Ref Range Status   Specimen Description BLOOD RIGHT HAND   Final   Special  Requests BOTTLES DRAWN AEROBIC ONLY 10CC   Final   Culture  Setup Time     Final   Value: 12/11/2013 03:40     Performed at [facility]   Culture     Final   Value: STAPHYLOCOCCUS SPECIES (COAGULASE NEGATIVE)     Vargas: THE SIGNIFICANCE OF ISOLATING THIS ORGANISM FROM A SINGLE SET OF BLOOD CULTURES WHEN MULTIPLE SETS ARE DRAWN IS UNCERTAIN. PLEASE NOTIFY THE MICROBIOLOGY DEPARTMENT WITHIN ONE WEEK IF SPECIATION AND SENSITIVITIES ARE REQUIRED.     Vargas: Gram Stain Report Called to,Read Back By and Verified With: [facility] RN AT [phone] BY CASTILLOC     Performed at [facility]   Report Status 12/14/2013 FINAL   Final  URINE CULTURE     Status: None   Collection Time    12/10/13  9:52 PM      Result Value Ref Range Status   Specimen Description URINE, RANDOM   Final   Special Requests NONE   Final   Culture  Setup Time     Final   Value: 12/11/2013 03:43     Performed at [place]     Final   Value: >=100,000 COLONIES/ML     Performed at [facility]   Culture     Final   Value: Multiple bacterial morphotypes present, none predominant.  Suggest appropriate recollection if clinically indicated.     Performed at [facility]   Report Status 12/12/2013 FINAL   Final     Studies:  Recent x-ray studies have been reviewed in detail by the Attending Physician  Time spent : 25 mins  Cherene Altes, MD [facility] For Consults/Admissions - Flow Manager - [phone] Office  [phone] Pager [phone]  On-Call/Text Page:      [email or web address]      password [facility]  12/15/2013, 2:22 PM   LOS: 5 days

## 2013-12-16 DIAGNOSIS — Z931 Gastrostomy status: Secondary | ICD-10-CM

## 2013-12-16 DIAGNOSIS — Z8673 Personal history of transient ischemic attack (TIA), and cerebral infarction without residual deficits: Secondary | ICD-10-CM

## 2013-12-16 DIAGNOSIS — Z515 Encounter for palliative care: Secondary | ICD-10-CM

## 2013-12-16 LAB — GLUCOSE, CAPILLARY
GLUCOSE-CAPILLARY: 236 mg/dL — AB (ref 70–99)
GLUCOSE-CAPILLARY: 285 mg/dL — AB (ref 70–99)
GLUCOSE-CAPILLARY: 312 mg/dL — AB (ref 70–99)
Glucose-Capillary: 258 mg/dL — ABNORMAL HIGH (ref 70–99)

## 2013-12-16 MED ORDER — OXYCODONE HCL 5 MG/5ML PO SOLN
5.0000 mg | ORAL | Status: DC | PRN
  Administered 2013-12-17 (×2): 5 mg
  Administered 2013-12-18: 10 mg
  Filled 2013-12-16: qty 5
  Filled 2013-12-16: qty 10
  Filled 2013-12-16: qty 5

## 2013-12-16 MED ORDER — CHLORHEXIDINE GLUCONATE 0.12 % MT SOLN
15.0000 mL | Freq: Two times a day (BID) | OROMUCOSAL | Status: DC
  Administered 2013-12-16 – 2013-12-19 (×6): 15 mL via OROMUCOSAL
  Filled 2013-12-16 (×8): qty 15

## 2013-12-16 MED ORDER — LORAZEPAM 1 MG PO TABS: 1.0000 mg | ORAL_TABLET | ORAL | Status: DC | PRN

## 2013-12-16 MED ORDER — FREE WATER: 100.0000 mL | Status: DC | PRN

## 2013-12-16 MED ORDER — BIOTENE DRY MOUTH MT LIQD
15.0000 mL | Freq: Two times a day (BID) | OROMUCOSAL | Status: DC
Start: 2013-12-17 — End: 2013-12-19
  Administered 2013-12-17 – 2013-12-18 (×4): 15 mL via OROMUCOSAL

## 2013-12-16 MED ORDER — GLUCERNA 1.2 CAL PO LIQD
1000.0000 mL | ORAL | Status: DC
  Administered 2013-12-16: 1000 mL
  Filled 2013-12-16 (×3): qty 1000

## 2013-12-16 MED ORDER — ONDANSETRON HCL 4 MG/2ML IJ SOLN: 4.0000 mg | Freq: Four times a day (QID) | INTRAMUSCULAR | Status: DC | PRN

## 2013-12-16 MED ORDER — SORBITOL 70 % SOLN: 30.0000 mL | Freq: Every day | Status: DC | PRN

## 2013-12-16 NOTE — Progress Notes (Signed)
NURSING PROGRESS NOTE  Todd Vargas [id number] Transfer Data: 12/16/2013 7:44 PM Attending Provider: Lonia Blood, MD WKG:[id number],YVOPF, MD Code Status: DNR  Todd Vargas is a 68 y.o. male patient transferred from 3S -No acute distress noted.  -No complaints of shortness of breath.  -No complaints of chest pain.   Cardiac Monitoring: n/a  Blood pressure 127/74, pulse 75, temperature 97.6 F (36.4 C), temperature source Oral, resp. rate 16, height 6' (1.829 m), weight 115.8 kg (255 lb 4.7 oz), SpO2 100.00%.   IV Fluids:  IV in place, occlusive dsg intact without redness, IV cath hand right, condition patent and no redness none.   Allergies:  Ibuprofen  Past Medical History:   has a past medical history of Hypertension; Obesity; Peripheral vascular disease; Atrial fibrillation; Pneumonia; Type II diabetes mellitus; GERD (gastroesophageal reflux disease); History of stomach ulcers; Headache(784.0); Arthritis; Chronic lower back pain; Diabetes mellitus without complication; HTN (hypertension); Hyperlipidemia; and Stroke.  Past Surgical History:   has past surgical history that includes Tonsillectomy (1950's); Appendectomy (1960's?); Abscess drainage (Right, 1960's); Excisional hemorrhoidectomy; Incision and drainage of wound (Right, 1970's); Multiple tooth extractions (2013); Esophagogastroduodenoscopy (N/A, 11/28/2013); and PEG placement (N/A, 11/28/2013).  Social History:   reports that he has never smoked. He does not have any smokeless tobacco history on file. He reports that he drinks alcohol. He reports that he uses illicit drugs.  Skin: Intact  Patient/Family orientated to room. Admission inpatient armband information verified with patient/family to include name and date of birth and placed on patient arm. Side rails up x 2, fall assessment and education completed with patient/family. Call light within reach. Patient/family able to voice and demonstrate understanding of unit  orientation instructions.    Will continue to evaluate and treat per MD orders.

## 2013-12-16 NOTE — Progress Notes (Signed)
  [name]  Todd Vargas MRN:[id number] DOB: 05/30/1946 DOA: 12/10/2013 PCP: MACKENZIE,BRIAN, MD  Brief narrative: 68-year-old male patient admitted on 11/18/2013 after suffering a massive right MCA ischemic stroke due to previously undiagnosed atrial fibrillation. That hospital course was complicated by subfalcine herniation as well as significant cerebral edema that required hypertonic saline. Prior to discharge patient had a baseline of severe left-sided hemi-neglect with other neurological deficits and was requiring a PEG tube for medications and feedings. Apparently at time of discharge he was awake and able to follow commands with the right side. Unfortunately over the preceding several days at the nursing home the patient developed altered mentation and was minimally responsive and therefore was sent to the emergency department. In the ER he was found to have a fever of 101.3, he had tachypnea, urinalysis consistent with UTI. He also had hyperglycemia without ketoacidosis and appeared to be severely dehydrated.  HPI/Subjective: Opens eyes but does not respond to interaction or questions.    Assessment/Plan:  Sepsis - multiple sources  -related to urinary tract infection + aspiration v/s healthcare acquired PNA -DO NOT RESUSCITATE -Telephone conference with Palliative and family- have now decided on comfort- plan dc to Hospice and dc PEG  Acute hypoxemic respiratory failure / R perihilar and L basilar HCAP vs asp PNA -after hydration patient develped thick yellow brown respiratory secretions suggesting HCAP vs aspiration pneumonia -02 requirements increased after admit, but then improved  -No significant gastric residuals when checked but aspiration of secretions remains possible   UTI  -Urinalysis highly abnormal - culture was not able to isolate a single offending organism - empiric abx were dosed but clinical condition did not improve    Metabolic encephalopathy -Likely related to sepsis and infectious processes, in setting of recent devastating CVA -EEG revealed hemispheric dysfunction which correlates with patient's known history of ischemic stroke - no seizures noted  -mental status has not improved despite ongoing medical care - support transition to comfort focused care   Atrial fibrillation with RVR -likely driven by dehydration and stress from sepsis -attempts to tx w/ IV cardizem were met w/ bradycardia  Diabetes mellitus type 2, uncontrolled  Hypertension -well controlled at present   Dehydration/Hypernatremia -now resolved   Chronic systolic heart failure -Compensated - EF 35-40% via TTE 11/20/2013  Dysphagia / PEG dependent -adom exam benign and no signif residual w/ tube feeds   History of large ischemic stroke with residual hemiparesis -long term prognosis poor -see above  DVT prophylaxis: SCDs Code Status: DO NOT RESUSCITATE Family Communication: no family present at time of exam today  Disposition Plan/Expected LOS: Transfer to floor- awaiting Hospice bed  Consultants: Neurology  Palliative Care   Procedures: None  Antibiotics: Zosyn 2/17 >>>2/23 Vancomycin 2/17 >>>2/21 Rocephin 2/18 >>> 2/19  Objective: Blood pressure 124/87, pulse 116, temperature 98.7 F (37.1 C), temperature source Oral, resp. rate 22, height 6' (1.829 m), weight 255 lb 4.7 oz (115.8 kg), SpO2 96.00%.  Intake/Output Summary (Last 24 hours) at 12/16/13 1401 Last data filed at 12/16/13 1200  Gross per 24 hour  Intake 1817.5 ml  Output    601 ml  Net 1216.5 ml   Exam: General: No acute respiratory distress - not interactive Lungs: Clear to auscultation bilaterally without wheezes or crackles, RA Cardiovascular: Regular rate without murmur gallop or rub  Abdomen: Nontender, nondistended, soft, bowel sounds positive, no rebound, no ascites, no appreciable mass - PEG insertion clean    Musculoskeletal: No  significant cyanosis, clubbing of bilateral lower extremities Neurological: Eyes open and occasionally turns head toward RN when spoken to - no verbal response - does not follow simple commands   Scheduled Meds:  Scheduled Meds: . diltiazem  30 mg Per Tube 4 times per day  . free water  200 mL Per Tube Q6H  . insulin aspart  0-15 Units Subcutaneous 4 times per day  . insulin glargine  18 Units Subcutaneous Daily  . metoprolol tartrate  50 mg Per Tube BID  . nystatin  5 mL Oral QID  . piperacillin-tazobactam (ZOSYN)  IV  3.375 g Intravenous 3 times per day  . Rivaroxaban  20 mg Per Tube Q supper   Data Reviewed: Basic Metabolic Panel:  Recent Labs Lab 12/10/13 2112 12/10/13 2121 12/11/13 0451 12/12/13 0228 12/13/13 0332 12/14/13 0307  NA 144 146 148* 151* 149* 143  K 4.8 4.6 4.4 4.5 4.0 3.6*  CL 106 109 113* 115* 113* 108  CO2 22  --  _0 GLUCOSE 534* 565* 462* 253* 204* 193*  BUN 42* 40* 39* 31* 20 13  CREATININE 0.88 1.00 0.83 0.82 0.76 0.70  CALCIUM 9.8  --  9.0 8.9 8.7 8.5   Liver Function Tests:  Recent Labs Lab 12/10/13 2112  AST 41*  ALT 49  ALKPHOS 191*  BILITOT 0.4  PROT 7.9  ALBUMIN 3.1*   CBC:  Recent Labs Lab 12/10/13 2112 12/10/13 2121 12/11/13 0451 12/13/13 0332 12/14/13 0307  WBC 10.2  --  8.9 9.7 7.9  NEUTROABS 5.9  --   --   --   --   HGB 15.0 16.3 15.1 11.8* 12.0*  HCT 45.9 48.0 46.5 37.9* 37.4*  MCV 78.7  --  79.2 82.6 80.4  PLT 173  --  125* 89* 88*   CBG:  Recent Labs Lab 12/15/13 1757 12/15/13 1831 12/15/13 2352 12/16/13 0637 12/16/13 1247  GLUCAP 346* 304* 312* 285* 258*    Recent Results (from the past 240 hour(s))  CULTURE, BLOOD (ROUTINE X 2)     Status: None   Collection Time    12/10/13  9:40 PM      Result Value Ref Range Status   Specimen Description BLOOD RIGHT ARM   Final   Special Requests BOTTLES DRAWN AEROBIC AND ANAEROBIC 10ML   Final   Culture  Setup Time     Final   Value: 12/11/2013 03:27       Performed at [facility]   Culture     Final   Value:        BLOOD CULTURE RECEIVED NO GROWTH TO DATE CULTURE WILL BE HELD FOR 5 DAYS BEFORE ISSUING A FINAL NEGATIVE REPORT     Performed at [facility]   Report Status PENDING   Incomplete  CULTURE, BLOOD (ROUTINE X 2)     Status: None   Collection Time    12/10/13  9:45 PM      Result Value Ref Range Status   Specimen Description BLOOD RIGHT HAND   Final   Special Requests BOTTLES DRAWN AEROBIC ONLY 10CC   Final   Culture  Setup Time     Final   Value: 12/11/2013 03:40     Performed at [facility]   Culture     Final   Value: STAPHYLOCOCCUS SPECIES (COAGULASE NEGATIVE)     Note: THE SIGNIFICANCE OF ISOLATING THIS ORGANISM FROM A SINGLE SET OF BLOOD  CULTURES WHEN MULTIPLE SETS ARE DRAWN IS UNCERTAIN. PLEASE NOTIFY THE MICROBIOLOGY DEPARTMENT WITHIN ONE WEEK IF SPECIATION AND SENSITIVITIES ARE REQUIRED.     Note: Gram Stain Report Called to,Read Back By and Verified With: [facility] RN AT [phone] BY CASTILLOC     Performed at [facility]   Report Status 12/14/2013 FINAL   Final  URINE CULTURE     Status: None   Collection Time    12/10/13  9:52 PM      Result Value Ref Range Status   Specimen Description URINE, RANDOM   Final   Special Requests NONE   Final   Culture  Setup Time     Final   Value: 12/11/2013 03:43     Performed at [place]     Final   Value: >=100,000 COLONIES/ML     Performed at [facility]   Culture     Final   Value: Multiple bacterial morphotypes present, none predominant. Suggest appropriate recollection if clinically indicated.     Performed at [facility]   Report Status 12/12/2013 FINAL   Final     Studies:  Recent x-ray studies have been reviewed in detail by the Attending Physician  Time spent : Friona, ANP [facility] For Consults/Admissions - Flow Manager - [phone] Office   [phone] Pager [phone]  On-Call/Text Page:      [email or web address] [email or web address]      password [facility]  12/16/2013, 2:01 PM   LOS: 6 days   I have personally examined this patient and reviewed the entire database. I have reviewed the above note, made any necessary editorial changes, and agree with its content.  Cherene Altes, MD [facility]

## 2013-12-16 NOTE — Progress Notes (Signed)
NUTRITION FOLLOW-UP  DOCUMENTATION CODES Per approved criteria  -Obesity Unspecified   INTERVENTION: For better glycemic control, change Osmolite 1.5 to Glucerna 1.2. Initiate Glucerna 1.2 at 30 ml/hr via PEG and increase by 10 ml q 4 hours to goal of 70 ml/hr. Goal regimen will provide: 2016 kcal, 101 grams protein, 1352 ml free water. Continue free water flushes of 200 ml QID to provide an additional 800 ml free water daily. RD to continue to follow nutrition care plan.  NUTRITION DIAGNOSIS: Inadequate oral intake related to inability to eat as evidenced by NPO status, TF dependency. Ongoing.   Goal: Pt to meet >/= 90% of their estimated nutrition needs. Met.   Monitor:  TF tolerance, goals of care, weight, labs, I/O's  ASSESSMENT: Patient was recently admitted on 1/26 and discharged last week after suffering a massive R MCA ischemic stroke due to previously undiagnosed A.Fib; brought in from his nursing home [facility]) minimally responsive with Kussmal respirations in the ED. Pt found to be severely dehydrated.  EEG completed 2/20 - consistent with a R hemispheric dysfunction consistent with the patient's known history of ischemic stroke.  Enteral nutrition via PEG started by MD on 2/21. Pt noted to have long-term poor prognosis.  Pt currently ordered for Osmolite 1.5 at 50 ml/hr via PEG with 200 ml free water flushes QID. This provides: 1800 kcal, 76 grams protein, 1714 ml free water. RN reports that pt is tolerating well. Pt with elevated CBGs of 285, 312, 304. Discussed changing enteral nutrition formula to Glucerna 1.2 with Dr. Thereasa Solo who is in agreement. Discussed with RN.   Height: Ht Readings from Last 1 Encounters:  12/11/13 6' (1.829 m)    Weight: Wt Readings from Last 1 Encounters:  12/16/13 255 lb 4.7 oz (115.8 kg)  Admit wt 262 lb  BMI:  Body mass index is 34.62 kg/(m^2). Obese Class I  Estimated Nutritional Needs: Kcal: 2000-2200 Protein: 100-110  gm Fluid: 2.0-2.2 L  Skin: Intact  Diet Order: NPO  EDUCATION NEEDS: -No education needs identified at this time   Intake/Output Summary (Last 24 hours) at 12/16/13 1022 Last data filed at 12/16/13 0800  Gross per 24 hour  Intake 1997.5 ml  Output    750 ml  Net 1247.5 ml    Last BM: 2/22  Labs:   Recent Labs Lab 12/12/13 0228 12/13/13 0332 12/14/13 0307  NA 151* 149* 143  K 4.5 4.0 3.6*  CL 115* 113* 108  CO2 24 24 22   BUN 31* 20 13  CREATININE 0.82 0.76 0.70  CALCIUM 8.9 8.7 8.5  GLUCOSE 253* 204* 193*    CBG (last 3)   Recent Labs  12/15/13 1831 12/15/13 2352 12/16/13 0637  GLUCAP 304* 312* 285*    Scheduled Meds: . diltiazem  30 mg Per Tube 4 times per day  . feeding supplement (OSMOLITE 1.5 CAL)  1,000 mL Per Tube Q24H  . free water  200 mL Per Tube Q6H  . insulin aspart  0-15 Units Subcutaneous 4 times per day  . insulin glargine  18 Units Subcutaneous Daily  . metoprolol tartrate  50 mg Per Tube BID  . nystatin  5 mL Oral QID  . piperacillin-tazobactam (ZOSYN)  IV  3.375 g Intravenous 3 times per day  . Rivaroxaban  20 mg Per Tube Q supper    Continuous Infusions:    Inda Coke MS, RD, LDN Inpatient Registered Dietitian Pager: [phone] After-hours pager: [phone]

## 2013-12-16 NOTE — Progress Notes (Signed)
Stopped tube feeding per palliative. Flushed peg. Called report to [employer] on 5 west. VSS. Patient transferred to [phone] with belongings. Family called and aware of new room number. Celesta Gentile

## 2013-12-16 NOTE — Progress Notes (Signed)
Thank you for consulting the Palliative Medicine Team at [facility] to meet your patient's and family's needs.   The reason that you asked [place] to see your patient is for GOC and options.  We have scheduled your patient for a meeting: 12/16/13 1430   The Surrogate decision make is: [name] (niece)  Contact information: facesheet  Other family members that need to be present: Lisa's mother    Your patient is able/unable to participate:unable  Yong Channel, NP Palliative Medicine Team Pager # [phone] Team Phone # [phone]
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
## 2013-12-17 LAB — CULTURE, BLOOD (ROUTINE X 2): Culture: NO GROWTH

## 2013-12-17 MED ORDER — BIOTENE DRY MOUTH MT LIQD: 15.0000 mL | Freq: Two times a day (BID) | OROMUCOSAL | Status: AC

## 2013-12-17 MED ORDER — SORBITOL 70 % SOLN: 30.0000 mL | Freq: Every day | Status: AC | PRN

## 2013-12-17 MED ORDER — CHLORHEXIDINE GLUCONATE 0.12 % MT SOLN: 15.0000 mL | Freq: Two times a day (BID) | OROMUCOSAL | Status: AC

## 2013-12-17 MED ORDER — LORAZEPAM 1 MG PO TABS: 1.0000 mg | ORAL_TABLET | ORAL | Status: AC | PRN

## 2013-12-17 MED ORDER — METOPROLOL TARTRATE 25 MG/10 ML ORAL SUSPENSION: 50.0000 mg | Freq: Two times a day (BID) | ORAL | Status: AC

## 2013-12-17 MED ORDER — FREE WATER: 100.0000 mL | Status: AC | PRN

## 2013-12-17 MED ORDER — OXYCODONE HCL 5 MG/5ML PO SOLN: 5.0000 mg | ORAL | Status: AC | PRN

## 2013-12-17 NOTE — Progress Notes (Signed)
Progress Note from the Palliative Medicine Team at [facility]  Subjective: Todd Vargas is more alert than yesterday and I believe he was trying to tell me he was in pain. He did lift his right arm for "yes" to pain. His family agreed yesterday that they did not want him to suffer and that we should focus on his comfort. His quality of life has been very poor since his stroke and he has not recovered and had numerous complications. I spoke with his niece, Todd Vargas, today via telephone and we will continue to focus on comfort and to look for placement in a hospice facility. She has no questions or concerns at this time.    Objective: Allergies  Allergen Reactions  . Ibuprofen Other (See Comments)    unknown   Scheduled Meds: . antiseptic oral rinse  15 mL Mouth Rinse q12n4p  . chlorhexidine  15 mL Mouth Rinse BID  . diltiazem  30 mg Per Tube 4 times per day  . metoprolol tartrate  50 mg Per Tube BID   Continuous Infusions:  PRN Meds:.acetaminophen (TYLENOL) oral liquid 160 mg/5 mL, free water, LORazepam, ondansetron (ZOFRAN) IV, oxyCODONE, sorbitol  BP 175/84  Pulse 130  Temp(Src) 98.5 F (36.9 C) (Axillary)  Resp 16  Ht 6' (1.829 m)  Wt 112.6 kg (248 lb 3.8 oz)  BMI 33.66 kg/m2  SpO2 97%   PPS: 20%  Pain Score: non-verbal    Intake/Output Summary (Last 24 hours) at 12/17/13 1313 Last data filed at 12/17/13 0531  Gross per 24 hour  Intake    530 ml  Output      1 ml  Net    529 ml      LBM: 12/16/13      Physical Exam:  General: NAD, resting  HEENT: [place]/AT, moist mucous membranes, no JVD  Chest: Irreg - A fib, S1 S2  CVS: CTA throughout, shallow breathes, no labored breathes  Abdomen: Soft, NT, ND, +BS, PEG  Ext: No edema, warm to touch  Neuro: Alert, responds to voice, following commands   Labs: CBC    Component Value Date/Time   WBC 7.9 12/14/2013 0307   RBC 4.65 12/14/2013 0307   RBC 4.80 03/03/2013 0731   HGB 12.0* 12/14/2013 0307   HCT 37.4* 12/14/2013 0307    PLT 88* 12/14/2013 0307   MCV 80.4 12/14/2013 0307   MCH 25.8* 12/14/2013 0307   MCHC 32.1 12/14/2013 0307   RDW 14.6 12/14/2013 0307   LYMPHSABS 3.5 12/10/2013 2112   MONOABS 0.8 12/10/2013 2112   EOSABS 0.1 12/10/2013 2112   BASOSABS 0.0 12/10/2013 2112    BMET    Component Value Date/Time   NA 143 12/14/2013 0307   K 3.6* 12/14/2013 0307   CL 108 12/14/2013 0307   CO2 22 12/14/2013 0307   GLUCOSE 193* 12/14/2013 0307   BUN 13 12/14/2013 0307   CREATININE 0.70 12/14/2013 0307   CALCIUM 8.5 12/14/2013 0307   GFRNONAA >90 12/14/2013 0307   GFRAA >90 12/14/2013 0307    CMP     Component Value Date/Time   NA 143 12/14/2013 0307   K 3.6* 12/14/2013 0307   CL 108 12/14/2013 0307   CO2 22 12/14/2013 0307   GLUCOSE 193* 12/14/2013 0307   BUN 13 12/14/2013 0307   CREATININE 0.70 12/14/2013 0307   CALCIUM 8.5 12/14/2013 0307   PROT 7.9 12/10/2013 2112   ALBUMIN 3.1* 12/10/2013 2112   AST 41* 12/10/2013 2112  ALT 49 12/10/2013 2112   ALKPHOS 191* 12/10/2013 2112   BILITOT 0.4 12/10/2013 2112   GFRNONAA >90 12/14/2013 0307   GFRAA >90 12/14/2013 0307    Assessment and Plan: 1. Code Status: DNR 2. Symptom Control: 1. Anxiety/Agitation: Lorazepam prn.  2. Pain: Oxycodone prn.  3. Bowel Regimen: Sorbitol prn.  4. Fever: Acetaminophen prn.  5. Nausea/Vomiting: Ondansetron prn.  3. Psycho/Social: Emotional support provided to patient and his niece, Todd Vargas via telephone. 4. Spiritual: Spiritual care following. 5. Disposition: Hopeful for hospice facility.    Time In Time Out Total Time Spent with Patient Total Overall Time  1400 1430 20min 30min    Greater than 50%  of this time was spent counseling and coordinating care related to the above assessment and plan.  Yong ChannelAlicia Morrie Daywalt, NP Palliative Medicine Team Pager # [phone] Team Phone # [phone]   1

## 2013-12-17 NOTE — Discharge Summary (Addendum)
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 11/19/2013 07:53   Dg Chest Port 1 View  11/18/2013   CLINICAL DATA:  Central line placement.  EXAM: PORTABLE CHEST - 1 VIEW  COMPARISON:  11/18/2013, 1051 hr  FINDINGS: Film at 1728 hr demonstrates placement of a left jugular central line with the tip transversely oriented at the brachiocephalic venous confluence/origin of SVC. No pneumothorax is identified. Lungs again demonstrate edema. The heart is enlarged.  IMPRESSION: Central line tip at the brachiocephalic venous confluence. No pneumothorax is identified.   Electronically Signed   By: Irish Lack M.D.   On: 11/18/2013 17:57   Dg Chest Portable 1 View  11/18/2013   CLINICAL DATA:  Weakness and altered mental status.  EXAM: PORTABLE CHEST - 1 VIEW  COMPARISON:  None.  FINDINGS: The lungs are borderline hypoinflated. The interstitial markings are increased bilaterally and are nearly confluent in some areas on the left. No air bronchograms are demonstrated. The cardiac silhouette is enlarged. The pulmonary vascularity is engorged and indistinct. There is no  pleural effusion or pneumothorax. The observed portions of the bony thorax appear normal.  IMPRESSION: The findings suggest congestive heart failure with pulmonary interstitial and early alveolar edema.   Electronically Signed   By: David  [place]   On: 11/18/2013 11:22   Dg Abd Portable 1v  11/23/2013   CLINICAL DATA:  Bedside feeding tube placement.  EXAM: PORTABLE ABDOMEN - 1 VIEW  COMPARISON:  DG ABD PORTABLE 1V dated 11/21/2013  FINDINGS: Motion blurs the image, though I can tell that the feeding tube tip is projecting at the expected location of the gastric fundus. Visualized bowel gas pattern unremarkable.  IMPRESSION: Feeding tube tip in the fundus of the stomach.   Electronically Signed  By: Hulan Saas M.D.   On: 11/23/2013 18:51   Dg Abd Portable 1v  11/21/2013   CLINICAL DATA:  Feeding tube  EXAM: PORTABLE ABDOMEN - 1 VIEW  COMPARISON:  None.  FINDINGS: Feeding tube identified looped within the stomach with tip in the region of the cardia of the stomach. Nonobstructive gas pattern.  IMPRESSION: Feeding tube looped within the stomach.   Electronically Signed   By: Esperanza Heir M.D.   On: 11/21/2013 13:23    Micro Results      Recent Results (from the past 240 hour(s))  CULTURE, BLOOD (ROUTINE X 2)     Status: None   Collection Time    12/10/13  9:40 PM      Result Value Ref Range Status   Specimen Description BLOOD RIGHT ARM   Final   Special Requests BOTTLES DRAWN AEROBIC AND ANAEROBIC   Final   Culture  Setup Time     Final   Value: 12/11/2013 03:27     Performed at [facility]   Culture     Final   Value: NO GROWTH 5 DAYS     Performed at [facility]   Report Status 12/17/2013 FINAL   Final  CULTURE, BLOOD (ROUTINE X 2)     Status: None   Collection Time    12/10/13  9:45 PM      Result Value Ref Range Status   Specimen Description BLOOD RIGHT HAND   Final   Special Requests BOTTLES DRAWN AEROBIC ONLY 10CC   Final   Culture  Setup Time      Final   Value: 12/11/2013 03:40     Performed at [facility]   Culture     Final   Value: STAPHYLOCOCCUS SPECIES (COAGULASE NEGATIVE)     Note: THE SIGNIFICANCE OF ISOLATING THIS ORGANISM FROM A SINGLE SET OF BLOOD CULTURES WHEN MULTIPLE SETS ARE DRAWN IS UNCERTAIN. PLEASE NOTIFY THE MICROBIOLOGY DEPARTMENT WITHIN ONE WEEK IF SPECIATION AND SENSITIVITIES ARE REQUIRED.     Note: Gram Stain Report Called to,Read Back By and Verified With: [facility] RN AT [phone] BY CASTILLOC     Performed at [facility]   Report Status 12/14/2013 FINAL   Final  URINE CULTURE     Status: None   Collection Time    12/10/13  9:52 PM      Result Value Ref Range Status   Specimen Description URINE, RANDOM   Final   Special Requests NONE   Final   Culture  Setup Time     Final   Value: 12/11/2013 03:43     Performed at [facility] Count     Final   Value: >=100,000 COLONIES/ML     Performed at [facility]   Culture     Final   Value: Multiple bacterial morphotypes present, none predominant. Suggest appropriate recollection if clinically indicated.     Performed at [facility]   Report Status 12/12/2013 FINAL   Final     History of present illness and  Hospital Course:     Kindly see H&P for history of present illness and admission details, please review complete Labs, Consult reports and Test reports for all details in brief Todd Vargas, is a 68 year old male patient admitted on 11/18/2013 after suffering a massive right MCA ischemic stroke due to previously undiagnosed atrial fibrillation, DM-2. That hospital course was complicated by subfalcine herniation  as well as significant cerebral edema that required hypertonic saline. Prior to discharge patient had a baseline of severe left-sided hemi-neglect with other neurological deficits and was requiring a PEG tube for medications and feedings. Apparently at time of discharge he was awake and able to follow  commands with the right side.     Unfortunately over the preceding several days at the nursing home the patient developed altered mentation and was minimally responsive and therefore was sent to the emergency department. In the ER he was found to have a fever of 101.3, he had tachypnea, urinalysis consistent with UTI. He also had hyperglycemia without ketoacidosis and appeared to be severely dehydrated.     He was diagnosed with aspiration pneumonia and sepsis, due to his baseline poor quality of life (PEG tube dependent feeds) a detailed discussion was made by the previous care team, palliative care and family and it was decided the patient will be best served with full comfort care. This was initiated on 12/16/2013. All antibiotics in a nursery medications have been stopped except for medications which will provide him comfort. He awaits a hospice bed.     His other medical problems are chronic systolic heart failure with EF of 35%, hypertension, type 2 diabetes mellitus, recent ischemic stroke, not all unnecessary medications except those which assisted in keeping him comfortable will be stopped as he is hospice now.     Today   Subjective:   Todd Vargas today is in bed, continues to have facial droop and right-sided weakness, denies any headache chest pain, no shortness of breath. No abdominal pain.  Objective:   Blood pressure 148/83, pulse 102, temperature 98.5 F (36.9 C), temperature source Axillary, resp. rate 16, height 6' (1.829 m), weight 112.6 kg (248 lb 3.8 oz), SpO2 97.00%.   Intake/Output Summary (Last 24 hours) at 12/17/13 1157 Last data filed at 12/17/13 0531  Gross per 24 hour  Intake    630 ml  Output    152 ml  Net    478 ml    Exam Awake answers basic questions, right-sided strength 1/5, facial droop,  [place].AT,PERRAL Supple Neck,No JVD, No cervical lymphadenopathy appriciated.  Symmetrical Chest wall movement, Good air movement bilaterally, CTAB RRR,No  Gallops,Rubs or new Murmurs, No Parasternal Heave +ve B.Sounds, Abd Soft, Non tender, No organomegaly appriciated, No rebound -guarding or rigidity. PEG tube in place site appears clean. No Cyanosis, Clubbing or edema, No new Rash or bruise  Data Review   CBC w Diff: Lab Results  Component Value Date   WBC 7.9 12/14/2013   HGB 12.0* 12/14/2013   HCT 37.4* 12/14/2013   PLT 88* 12/14/2013   LYMPHOPCT 34 12/10/2013   MONOPCT 8 12/10/2013   EOSPCT 1 12/10/2013   BASOPCT 0 12/10/2013    CMP: Lab Results  Component Value Date   NA 143 12/14/2013   K 3.6* 12/14/2013   CL 108 12/14/2013   CO2 22 12/14/2013   BUN 13 12/14/2013   CREATININE 0.70 12/14/2013   PROT 7.9 12/10/2013   ALBUMIN 3.1* 12/10/2013   BILITOT 0.4 12/10/2013   ALKPHOS 191* 12/10/2013   AST 41* 12/10/2013   ALT 49 12/10/2013  .   Total Time in preparing paper work, data evaluation and todays exam - 35 minutes  GHIMIRE,SHANKER M.D on 12/18/2013 at 11:57 AM  [facility] Office  [phone]

## 2013-12-17 NOTE — Progress Notes (Signed)
Chaplain went to pt's room and was unable to awaken pt from sleep.  Chaplain also attempted to visit pt this morning.  Will follow as needed.   12/17/13 1400  Clinical Encounter Type  Visited With Patient  Visit Type Spiritual support    Rulon Abide, chaplain pager [phone]

## 2013-12-17 NOTE — Care Management Note (Addendum)
    Page 1 of 1   12/18/2013     1:42:56 PM   CARE MANAGEMENT NOTE 12/18/2013  Patient:  Todd Vargas, Todd Vargas   Account Number:  [id number]  Date Initiated:  12/12/2013  Documentation initiated by:  Donn Pierini  Subjective/Objective Assessment:   Pt admitted with hemorrhagic stroke and sepsis     Action/Plan:   PTA pt lived at [facility]- CSW consulted for placement needs   Anticipated DC Date:  12/19/2013   Anticipated DC Plan:  [facility]  In-house referral  Clinical Social Worker      DC Planning Services  CM consult      Choice offered to / List presented to:             Status of service:  Completed, signed off Medicare Important Message given?   (If response is "NO", the following Medicare IM given date fields will be blank) Date Medicare IM given:   Date Additional Medicare IM given:    Discharge Disposition:  HOSPICE MEDICAL FACILITY  Per UR Regulation:  Reviewed for med. necessity/level of care/duration of stay  If discussed at Long Length of Stay Meetings, dates discussed:   12/17/2013    Comments:  12/18/13 1342 Letha Cape RN, BSN [phone] patient for dc to [facility] on 2/26, CSW following.  12/17/13 1410 Letha Cape RN, BSN [phone] [facility]  12/16/13- 1300- Donn Pierini RN, BSN [phone] Plan for PC mtg this afternoon- d/c planning return to SNF vs Hospice Home- CSW following

## 2013-12-17 NOTE — Clinical Social Work Note (Signed)
[facility] states they will follow up about patient and may have a bed available tomorrow.  Roddie Mc, [place], [place], [phone]

## 2013-12-17 NOTE — Discharge Instructions (Signed)
Goal of care is comfort. Medications and feeds if needed or directed towards comfort only. Patient is DO NOT RESUSCITATE and under hospice care.  Diet and medications through PEG tube

## 2013-12-17 NOTE — Clinical Social Work Note (Signed)
Hospice referrals made.  Roddie Mc, [place], [place], [phone]

## 2013-12-17 NOTE — Progress Notes (Addendum)
Progress Note    Todd Vargas ZOX:[id number] DOB: 1945/12/12 DOA: 12/10/2013 PCP: Thayer Headings, MD    Brief narrative: 68 year old male patient admitted on 11/18/2013 after suffering a massive right MCA ischemic stroke due to previously undiagnosed atrial fibrillation. That hospital course was complicated by subfalcine herniation as well as significant cerebral edema that required hypertonic saline. Prior to discharge patient had a baseline of severe left-sided hemi-neglect with other neurological deficits and was requiring a PEG tube for medications and feedings. Apparently at time of discharge he was awake and able to follow commands with the right side. Unfortunately over the preceding several days at the nursing home the patient developed altered mentation and was minimally responsive and therefore was sent to the emergency department. In the ER he was found to have a fever of 101.3, he had tachypnea, urinalysis consistent with UTI. He also had hyperglycemia without ketoacidosis and appeared to be severely dehydrated.   He was diagnosed with aspiration pneumonia and sepsis, due to his baseline poor quality of life (PEG tube dependent feeds) a detailed discussion was made by the previous care team, palliative care and family and it was decided the patient will be best served with full comfort care. This was initiated on 12/16/2013. All antibiotics in a nursery medications have been stopped except for medications which will provide him comfort. He awaits a hospice bed.     HPI/Subjective:  Opens eyes and answers basic questions, denies any headache or chest pain, no shortness of breath at this time.    Assessment/Plan:   Sepsis - multiple sources  -related to urinary tract infection + aspiration  healthcare acquired PNA -DO NOT RESUSCITATE -Telephone conference with Palliative and family- have now decided on comfort- plan dc to Hospice and dc PEG    Acute hypoxemic respiratory  failure / R perihilar and L basilar HCAP DUE to aspiration -after hydration patient develped thick yellow brown respiratory secretions suggesting HCAP vs aspiration pneumonia -02 requirements increased after admit, but then improved  -Now supportive care only    UTI  -Urinalysis highly abnormal - culture was not able to isolate a single offending organism - empiric abx were dosed but clinical condition did not improve so they were stopped.    Metabolic encephalopathy -Likely related to sepsis and infectious processes, in setting of recent devastating CVA -EEG revealed hemispheric dysfunction which correlates with patient's known history of ischemic stroke - no seizures noted  -mental status has minimally improved but unlikely to regain any quality of life.    Atrial fibrillation with RVR -likely driven by dehydration and stress from sepsis -Will attenuate control medications via PEG tube    Diabetes mellitus type 2, uncontrolled  Supportive care only, goal of care now comfort  Hypertension -well controlled at present     Dehydration/Hypernatremia -now resolved     Chronic systolic heart failure -Compensated - EF 35-40% via TTE 11/20/2013    Dysphagia / PEG dependent -Now stopped as patient is hospice per previous care team    History of large ischemic stroke with residual hemiparesis -long term prognosis poor -see above     DVT prophylaxis: SCDs Code Status: DO NOT RESUSCITATE Family Communication: no family present at time of exam today  Disposition Plan/Expected LOS: Transfer to floor- awaiting Hospice bed    Consultants: Neurology  Palliative Care     Procedures: None   Antibiotics: Zosyn 2/17 >>>2/23 Vancomycin 2/17 >>>2/21 Rocephin 2/18 >>> 2/19   Objective: Blood pressure 148/83,  pulse 102, temperature 98.5 F (36.9 C), temperature source Axillary, resp. rate 16, height 6' (1.829 m), weight 112.6 kg (248 lb 3.8 oz), SpO2  97.00%.  Intake/Output Summary (Last 24 hours) at 12/17/13 1145 Last data filed at 12/17/13 0531  Gross per 24 hour  Intake    630 ml  Output    152 ml  Net    478 ml   Exam: General: No acute respiratory distress - not interactive Lungs: Clear to auscultation bilaterally without wheezes or crackles, RA Cardiovascular: Regular rate without murmur gallop or rub  Abdomen: Nontender, nondistended, soft, bowel sounds positive, no rebound, no ascites, no appreciable mass - PEG insertion clean  Musculoskeletal: No significant cyanosis, clubbing of bilateral lower extremities Neurological: Eyes open and occasionally turns head toward RN when spoken to - lungs are basic questions, - severe right-sided weakness facial droop.   Scheduled Meds:  Scheduled Meds: . antiseptic oral rinse  15 mL Mouth Rinse q12n4p  . chlorhexidine  15 mL Mouth Rinse BID  . diltiazem  30 mg Per Tube 4 times per day  . metoprolol tartrate  50 mg Per Tube BID    Data Reviewed: Basic Metabolic Panel:  Recent Labs Lab 12/10/13 2112 12/10/13 2121 12/11/13 0451 12/12/13 0228 12/13/13 0332 12/14/13 0307  NA 144 146 148* 151* 149* 143  K 4.8 4.6 4.4 4.5 4.0 3.6*  CL 106 109 113* 115* 113* 108  CO2 22  --  20 24 24 22   GLUCOSE 534* 565* 462* 253* 204* 193*  BUN 42* 40* 39* 31* 20 13  CREATININE 0.88 1.00 0.83 0.82 0.76 0.70  CALCIUM 9.8  --  9.0 8.9 8.7 8.5   Liver Function Tests:  Recent Labs Lab 12/10/13 2112  AST 41*  ALT 49  ALKPHOS 191*  BILITOT 0.4  PROT 7.9  ALBUMIN 3.1*   CBC:  Recent Labs Lab 12/10/13 2112 12/10/13 2121 12/11/13 0451 12/13/13 0332 12/14/13 0307  WBC 10.2  --  8.9 9.7 7.9  NEUTROABS 5.9  --   --   --   --   HGB 15.0 16.3 15.1 11.8* 12.0*  HCT 45.9 48.0 46.5 37.9* 37.4*  MCV 78.7  --  79.2 82.6 80.4  PLT 173  --  125* 89* 88*   CBG:  Recent Labs Lab 12/15/13 1831 12/15/13 2352 12/16/13 0637 12/16/13 1247 12/16/13 2014  GLUCAP 304* 312* 285* 258* 236*     Recent Results (from the past 240 hour(s))  CULTURE, BLOOD (ROUTINE X 2)     Status: None   Collection Time    12/10/13  9:40 PM      Result Value Ref Range Status   Specimen Description BLOOD RIGHT ARM   Final   Special Requests BOTTLES DRAWN AEROBIC AND ANAEROBIC 10ML   Final   Culture  Setup Time     Final   Value: 12/11/2013 03:27     [facility]formed at [facility]   Culture     Final   Value: NO GROWTH 5 DAYS     [facility]formed at [facility]   Report Status 12/17/2013 FINAL   Final  CULTURE, BLOOD (ROUTINE X 2)     Status: None   Collection Time    12/10/13  9:45 PM      Result Value Ref Range Status   Specimen Description BLOOD RIGHT HAND   Final   Special Requests BOTTLES DRAWN AEROBIC ONLY 10CC   Final   Culture  Setup  Time     Final   Value: 12/11/2013 03:40     Performed at [facility]   Culture     Final   Value: STAPHYLOCOCCUS SPECIES (COAGULASE NEGATIVE)     Note: THE SIGNIFICANCE OF ISOLATING THIS ORGANISM FROM A SINGLE SET OF BLOOD CULTURES WHEN MULTIPLE SETS ARE DRAWN IS UNCERTAIN. PLEASE NOTIFY THE MICROBIOLOGY DEPARTMENT WITHIN ONE WEEK IF SPECIATION AND SENSITIVITIES ARE REQUIRED.     Note: Gram Stain Report Called to,Read Back By and Verified With: [facility] RN AT [phone] BY CASTILLOC     Performed at [facility]   Report Status 12/14/2013 FINAL   Final  URINE CULTURE     Status: None   Collection Time    12/10/13  9:52 PM      Result Value Ref Range Status   Specimen Description URINE, RANDOM   Final   Special Requests NONE   Final   Culture  Setup Time     Final   Value: 12/11/2013 03:43     Performed at [facility] Count     Final   Value: >=100,000 COLONIES/ML     Performed at [facility]   Culture     Final   Value: Multiple bacterial morphotypes present, none predominant. Suggest appropriate recollection if clinically indicated.     Performed at [facility]   Report Status  12/12/2013 FINAL   Final     Time spent : 35 mins  On-Call/Text Page:      [email or web address]      password [facility]  12/17/2013, 11:45 AM   LOS: 7 days    Susa Raring, MD [facility]

## 2013-12-18 MED ORDER — METOPROLOL TARTRATE 25 MG/10 ML ORAL SUSPENSION
50.0000 mg | Freq: Two times a day (BID) | ORAL | Status: DC
  Administered 2013-12-18 – 2013-12-19 (×2): 50 mg
  Filled 2013-12-18 (×3): qty 20

## 2013-12-18 NOTE — Consult Note (Signed)
[facility] Liaison: [place] room available for Mr. Calica tomorrow. CSW [place] aware. Spoke with niece by phone and met with sister Estill Bamberg at [facility] to complete registration paperwork. Please fax discharge summary to [phone] and have RN call report to [phone]. Please arrange transport for Mr. Hussar to arrive before noon. Thank you. Erling Conte LCSW [phone]

## 2013-12-18 NOTE — Progress Notes (Signed)
Progress Note from the Palliative Medicine Team at [facility]  Todd Vargas is still complaining of pain this morning and says it is pain in his leg (he moves his right leg - so I believe it is his right leg). He gave me a thumbs up after I repositioned and placed his legs on pillows. He is more alert and more conversational, even more so than yesterday. His quality of life is still poor and his family has d/c his tube feedings and wish to focus on quality of life. We have previously explained that he will always be very high risk for aspiration and infection (PNA/UTI) in his condition and they have chosen to focus on comfort so he will not suffer. I will continue to follow and support.  Yong Channel, NP Palliative Medicine Team Pager # [phone] (M-F 8a-5p) Team Phone # [phone] (Nights/Weekends)

## 2013-12-18 NOTE — Progress Notes (Signed)
PATIENT DETAILS Name: Todd Vargas Age: 68 y.o. Sex: male Date of Birth: [birth year] Admit Date: 12/10/2013 Admitting Physician Hillary Bow, DO WUJ:[id number],NFAOZ, MD  Subjective: No major issues. Awaiting evaluation by [facility].  Assessment/Plan: Sepsis - multiple sources  -related to urinary tract infection + aspiration healthcare acquired PNA  - Have transitioned to a full comfort measures, all antibiotics have been subsequently discontinued.  Acute hypoxemic respiratory failure / R perihilar and L basilar HCAP DUE to aspiration  -after hydration patient develped thick yellow brown respiratory secretions suggesting HCAP vs aspiration pneumonia  -Now supportive care only   UTI  -Urinalysis highly abnormal - culture was not able to isolate a single offending organism - empiric abx were dosed but clinical condition did not improve so they were stopped.   Metabolic encephalopathy  -Likely related to sepsis and infectious processes, in setting of recent devastating CVA  -EEG revealed hemispheric dysfunction which correlates with patient's known history of ischemic stroke - no seizures noted  -mental status has minimally improved but unlikely to regain any quality of life.   Atrial fibrillation with RVR  - Transitioning to comfort measures-continue with metoprolol as long as possible, stop all anticoagulation  Diabetes mellitus type 2, uncontrolled  Supportive care only, goal of care now comfort   Hypertension  -well controlled at present- continue with metoprolol rate control for A. fib-this should adequately control his blood pressure. Continue as long as possible.  Dehydration/Hypernatremia  -now resolved   Chronic systolic heart failure  -Compensated - EF 35-40% via TTE 11/20/2013   Dysphagia / PEG dependent  -Now stopped as patient is hospice per previous care team  History of large ischemic stroke with residual hemiparesis  -long term  prognosis poor   Disposition: Remain inpatient- await residential hospice placement  DVT Prophylaxis: Not needed-comfort care  Code Status: DNR  Family Communication None at bedside  Procedures:  None  CONSULTS:  Palliative care  Time spent 40 minutes-which includes 50% of the time with face-to-face with patient/ family and coordinating care related to the above assessment and plan.    MEDICATIONS: Scheduled Meds: . antiseptic oral rinse  15 mL Mouth Rinse q12n4p  . chlorhexidine  15 mL Mouth Rinse BID  . diltiazem  30 mg Per Tube 4 times per day  . metoprolol tartrate  50 mg Per Tube BID   Continuous Infusions:  PRN Meds:.acetaminophen (TYLENOL) oral liquid 160 mg/5 mL, free water, LORazepam, ondansetron (ZOFRAN) IV, oxyCODONE, sorbitol  Antibiotics: Anti-infectives   Start     Dose/Rate Route Frequency Ordered Stop   12/13/13 0500  vancomycin (VANCOCIN) 1,250 mg in sodium chloride 0.9 % 250 mL IVPB  Status:  Discontinued     1,250 mg 166.7 mL/hr over 90 Minutes Intravenous Every 8 hours 12/12/13 1550 12/14/13 1138   12/12/13 1700  vancomycin (VANCOCIN) 2,000 mg in sodium chloride 0.9 % 500 mL IVPB     2,000 mg 250 mL/hr over 120 Minutes Intravenous  Once 12/12/13 1550 12/13/13 0037   12/12/13 1630  piperacillin-tazobactam (ZOSYN) IVPB 3.375 g  Status:  Discontinued     3.375 g 12.5 mL/hr over 240 Minutes Intravenous 3 times per day 12/12/13 1618 12/16/13 1716   12/12/13 1600  levofloxacin (LEVAQUIN) IVPB 750 mg  Status:  Discontinued     750 mg 100 mL/hr over 90 Minutes Intravenous Every 24 hours 12/12/13 1547 12/12/13 1612   12/12/13 1000  fluconazole (DIFLUCAN)  IVPB 100 mg     100 mg 50 mL/hr over 60 Minutes Intravenous Every 24 hours 12/12/13 0835 12/14/13 1028   12/12/13 0800  cefTRIAXone (ROCEPHIN) 1 g in dextrose 5 % 50 mL IVPB  Status:  Discontinued     1 g 100 mL/hr over 30 Minutes Intravenous Every 24 hours 12/11/13 0009 12/11/13 0234   12/11/13  1000  vancomycin (VANCOCIN) 1,250 mg in sodium chloride 0.9 % 250 mL IVPB  Status:  Discontinued     1,250 mg 166.7 mL/hr over 90 Minutes Intravenous Every 12 hours 12/10/13 2143 12/11/13 0009   12/11/13 1000  cefTRIAXone (ROCEPHIN) 1 g in dextrose 5 % 50 mL IVPB  Status:  Discontinued     1 g 100 mL/hr over 30 Minutes Intravenous Every 24 hours 12/11/13 0234 12/12/13 1502   12/11/13 0400  piperacillin-tazobactam (ZOSYN) IVPB 3.375 g  Status:  Discontinued     3.375 g 12.5 mL/hr over 240 Minutes Intravenous Every 8 hours 12/10/13 2143 12/11/13 0009   12/10/13 2145  piperacillin-tazobactam (ZOSYN) IVPB 3.375 g     3.375 g 100 mL/hr over 30 Minutes Intravenous  Once 12/10/13 2130 12/11/13 0033   12/10/13 2145  vancomycin (VANCOCIN) IVPB 1000 mg/200 mL premix  Status:  Discontinued     1,000 mg 200 mL/hr over 60 Minutes Intravenous  Once 12/10/13 2130 12/10/13 2137   12/10/13 2145  vancomycin (VANCOCIN) 2,000 mg in sodium chloride 0.9 % 500 mL IVPB     2,000 mg 250 mL/hr over 120 Minutes Intravenous  Once 12/10/13 2137 12/11/13 0056       PHYSICAL EXAM: Vital signs in last 24 hours: Filed Vitals:   12/17/13 2137 12/18/13 0023 12/18/13 0504 12/18/13 0952  BP: 139/71 116/72 135/84 132/76  Pulse:  82 81 56  Temp:   97.7 F (36.5 C)   TempSrc:   Oral   Resp:   18   Height:      Weight:      SpO2:   99%     Weight change:  Filed Weights   12/11/13 0247 12/16/13 0400 12/16/13 2053  Weight: 119.2 kg (262 lb 12.6 oz) 115.8 kg (255 lb 4.7 oz) 112.6 kg (248 lb 3.8 oz)   Body mass index is 33.66 kg/(m^2).   Gen Exam: Awake,not responding-left sided neglect Neck: Supple, No Chest: B/L Clear.  CVS: S1 S2 Regular, no murmurs.  Abdomen: soft, BS +, non tender, non distended.  Extremities: no edema, lower extremities warm to touch.  Intake/Output from previous day: No intake or output data in the 24 hours ending 12/18/13 1013   LAB RESULTS: CBC  Recent Labs Lab 12/13/13 0332  12/14/13 0307  WBC 9.7 7.9  HGB 11.8* 12.0*  HCT 37.9* 37.4*  PLT 89* 88*  MCV 82.6 80.4  MCH 25.7* 25.8*  MCHC 31.1 32.1  RDW 15.1 14.6    Chemistries   Recent Labs Lab 12/12/13 0228 12/13/13 0332 12/14/13 0307  NA 151* 149* 143  K 4.5 4.0 3.6*  CL 115* 113* 108  CO2 24 24 22   GLUCOSE 253* 204* 193*  BUN 31* 20 13  CREATININE 0.82 0.76 0.70  CALCIUM 8.9 8.7 8.5    CBG:  Recent Labs Lab 12/15/13 1831 12/15/13 2352 12/16/13 0637 12/16/13 1247 12/16/13 2014  GLUCAP 304* 312* 285* 258* 236*    GFR Estimated Creatinine Clearance: 114.5 ml/min (by C-G formula based on Cr of 0.7).  Coagulation profile No results found for this basename:  INR, PROTIME,  in the last 168 hours  Cardiac Enzymes No results found for this basename: CK, CKMB, TROPONINI, MYOGLOBIN,  in the last 168 hours  No components found with this basename: POCBNP,  No results found for this basename: DDIMER,  in the last 72 hours No results found for this basename: HGBA1C,  in the last 72 hours No results found for this basename: CHOL, HDL, LDLCALC, TRIG, CHOLHDL, LDLDIRECT,  in the last 72 hours No results found for this basename: TSH, T4TOTAL, FREET3, T3FREE, THYROIDAB,  in the last 72 hours No results found for this basename: VITAMINB12, FOLATE, FERRITIN, TIBC, IRON, RETICCTPCT,  in the last 72 hours No results found for this basename: LIPASE, AMYLASE,  in the last 72 hours  Urine Studies No results found for this basename: UACOL, UAPR, USPG, UPH, UTP, UGL, UKET, UBIL, UHGB, UNIT, UROB, ULEU, UEPI, UWBC, URBC, UBAC, CAST, CRYS, UCOM, BILUA,  in the last 72 hours  MICROBIOLOGY: Recent Results (from the past 240 hour(s))  CULTURE, BLOOD (ROUTINE X 2)     Status: None   Collection Time    12/10/13  9:40 PM      Result Value Ref Range Status   Specimen Description BLOOD RIGHT ARM   Final   Special Requests BOTTLES DRAWN AEROBIC AND ANAEROBIC   Final   Culture  Setup Time     Final    Value: 12/11/2013 03:27     Performed at [facility]   Culture     Final   Value: NO GROWTH 5 DAYS     Performed at [facility]   Report Status 12/17/2013 FINAL   Final  CULTURE, BLOOD (ROUTINE X 2)     Status: None   Collection Time    12/10/13  9:45 PM      Result Value Ref Range Status   Specimen Description BLOOD RIGHT HAND   Final   Special Requests BOTTLES DRAWN AEROBIC ONLY 10CC   Final   Culture  Setup Time     Final   Value: 12/11/2013 03:40     Performed at [facility]   Culture     Final   Value: STAPHYLOCOCCUS SPECIES (COAGULASE NEGATIVE)     Note: THE SIGNIFICANCE OF ISOLATING THIS ORGANISM FROM A SINGLE SET OF BLOOD CULTURES WHEN MULTIPLE SETS ARE DRAWN IS UNCERTAIN. PLEASE NOTIFY THE MICROBIOLOGY DEPARTMENT WITHIN ONE WEEK IF SPECIATION AND SENSITIVITIES ARE REQUIRED.     Note: Gram Stain Report Called to,Read Back By and Verified With: [facility] RN AT [phone] BY CASTILLOC     Performed at [facility]   Report Status 12/14/2013 FINAL   Final  URINE CULTURE     Status: None   Collection Time    12/10/13  9:52 PM      Result Value Ref Range Status   Specimen Description URINE, RANDOM   Final   Special Requests NONE   Final   Culture  Setup Time     Final   Value: 12/11/2013 03:43     Performed at [facility] Count     Final   Value: >=100,000 COLONIES/ML     Performed at [facility]   Culture     Final   Value: Multiple bacterial morphotypes present, none predominant. Suggest appropriate recollection if clinically indicated.     Performed at [facility]   Report Status 12/12/2013 FINAL   Final  RADIOLOGY STUDIES/RESULTS: Ct Angio Head W/cm &/or Wo Cm  11/18/2013   CLINICAL DATA:  Stroke could, followup.  EXAM: CT ANGIOGRAPHY HEAD AND NECK  TECHNIQUE: Multidetector CT imaging of the head and neck was performed using the standard protocol during bolus administration of intravenous contrast.  Multiplanar CT image reconstructions including MIPs were obtained to evaluate the vascular anatomy. Carotid stenosis measurements (when applicable) are obtained utilizing NASCET criteria, using the distal internal carotid diameter as the denominator.  CONTRAST:  72mL OMNIPAQUE IOHEXOL 350 MG/ML SOLN  COMPARISON:  CT of the head November 18 2013 at 1204 pm  FINDINGS: CTA HEAD FINDINGS  Anterior circulation: The petrous, cavernous and supra clinoid internal carotid arteries appear widely patent with moderate calcific atherosclerosis. Poor visualization of the A1 segments bilaterally. Robust left M1 segment, however there is poor visualization of the right M1 segment, with hazy appearance, which may be accentuated by venous contamination. There is overall attenuated appearance of the right M2 and M3 branches. There is suspected filling defect of the carotid terminus propagating to the proximal right M1 segment, as well as the right A1 segment (coronal 114/229). Patent anterior communicating artery with robust appearance of the bilateral anterior cerebral arteries and more distal branches.  Posterior circulation: Normal appearance of the vertebrobasilar junction, however the left vertebral artery appears to predominately terminate in the left posterior inferior cerebellar artery. Robust bilateral posterior communicating arteries with normal appearance of posterior cerebral arteries.  No aneurysm.  No suspicious luminal irregularity.  Large right middle cerebral artery territory infarct again noted, with 5 mm of right to left subfalcine herniation, increased. Increasing enlargement of the left ventricle atrium concerning for entrapment.  Review of the MIP images confirms the above findings.  CTA NECK FINDINGS  Normal 3 vessel aortic arch with minimal calcific atherosclerosis of the left subclavian artery origin. Widely patent origin of bilateral common carotid arteries. Bilateral common carotid arteries course in a  straight line fashion, with up to 2 mm of eccentric calcific atherosclerosis of the carotid bifurcations/ internal carotid artery origins without hemodynamically significant stenosis. Medialized course of the cervical internal carotid arteries, may be transient. Right tonsillar loop, with tortuous course, to a lesser extent on the left. The bilateral cervical internal carotid arteries are widely patent.  Due to streak artifact from retained contrast in the low left subclavian vein, limited evaluation of origin of left vertebral artery. 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 Bilateral vertebral arteries are normal course and caliber, the left vertebral artery predominately terminates in the left posterior-inferior cerebellar artery.  Left internal jugular central venous catheter with distal tip at brachiocephalic confluence. Included view of the chest demonstrates apparent mediastinal lymphadenopathy, partially characterized. Airways patent. Patient is edentulous. Degenerative change of hte cervical spine, incompletely characterized.  Review of the MIP images confirms the above findings.  IMPRESSION: CTA head: Poor visualization of the right carotid terminus concerning for embolus propagating into the proximal right M1 and right A1 segments, however there is reconstitution distally suggesting nonocclusive embolus or early re- cannulization. Mildly attenuated right M2 and M3 appearance which may be in part due to compressive changes from right cerebral edema and evolving right MCA territory infarct. Worsening right cerebral edema, increasing right to left subfalcine herniation.  Complete circle of Willis.  CTA neck:  No hemodynamically significant stenosis.  Suspected mediastinal lymphadenopathy, partially imaged.  Preliminary findings discussed by Dr. Miles Costain and confirmed by Dr. Danielle Dess on November 18, 2013 at approximately  2200 hr.   Electronically Signed    By: Awilda Metroourtnay  Bloomer   On: 11/18/2013 22:49   Ct Head Wo Contrast  12/11/2013   CLINICAL DATA:  Altered mental status.  EXAM: CT HEAD WITHOUT CONTRAST  TECHNIQUE: Contiguous axial images were obtained from the base of the skull through the vertex without intravenous contrast.  COMPARISON:  CT of the head November 23, 2013.  FINDINGS: Moderately motion degraded examination. Right frontotemporal parietal wedge-like hypodensity consistent with right middle cerebral artery territory infarct again noted including right basal ganglia involvement, there is overall associated last mass effect, with minimal petechial hemorrhage. Mild ex vacuo dilatation right lateral ventricle now seen. Mild asymmetric left cerebellar volume loss may reflect early across cerebellar diaschisis. No acute large vascular territory infarct.  No hydrocephalus, re-expanded right lateral ventricle ; moderate ventriculomegaly, likely on the basis of global parenchymal brain volume loss as there is overall commensurate enlargement of cerebral sulci and cerebellar folia. Again noticed patchy hypodensities within the left thalamus most consistent with remote lacunar infarcts in addition to confluent supratentorial white matter hypodensities exclusive of the right middle cerebral artery territory infarct most consistent with chronic small vessel ischemic disease. Moderate calcific atherosclerosis of the carotid siphons.  Patient is edentulous. Atretic maxillary sinuses suggest sequela chronic sinusitis without acute component. Mastoid air cells are well aerated. Soft tissue within the right external auditory canal likely reflects cerumen. No skull fracture. Ocular globes and orbital contents are nonsuspicious.  IMPRESSION: Subacute to early chronic right middle cerebral artery territory infarct with re-expanded right lateral ventricle, no residual entrapment or hydrocephalus. No acute intracranial process.  Severe white matter changes suggest chronic  small vessel ischemic disease with remote left thalamus lacunar infarcts.   Electronically Signed   By: Awilda Metroourtnay  Bloomer   On: 12/11/2013 00:19   Ct Head Wo Contrast  11/23/2013   CLINICAL DATA:  Followup right middle cerebral artery stroke.  EXAM: CT HEAD WITHOUT CONTRAST  TECHNIQUE: Contiguous axial images were obtained from the base of the skull through the vertex without intravenous contrast.  COMPARISON:  CT of the head November 20, 2013  FINDINGS: Right frontotemporal parietal wedge-like hypodensity again noted with sulcal effacement. 7 mm of right to left midline shift (previously 10 mm), with mild re-expansion of the right lateral ventricle. However there is mild, though improved, dilatation of left lateral ventricle atrium, with minimal transependymal flow cerebral spinal fluid. No intraparenchymal hemorrhage. Patchy supratentorial white matter hypodensities exclusive of the right middle cerebral artery territory stroke. Patchy left subthalamic hypodensities are unchanged likely reflecting rib fluid coronary infarcts.  Basal cisterns are patent. Severe calcific atherosclerosis of the carotid siphons. Patient is edentulous. No skull fracture. Atretic right greater than left maxillary sinuses may reflect chronic sinusitis without acute component. Visualized mastoid air cells are well aerated. Included ocular globes and orbital contents are unremarkable though not tailored for evaluation.  IMPRESSION: Evolving right middle cerebral artery territory infarct with overall decreased mass effect, no hemorrhagic conversion. Decreasing entrapment with transependymal flow cerebral spinal fluid, improved.  Moderate white matter changes suggest chronic small vessel ischemic disease, in addition to remote left lacunar infarct.   Electronically Signed   By: Awilda Metroourtnay  Bloomer   On: 11/23/2013 07:05   Ct Head Wo Contrast  11/20/2013   CLINICAL DATA:  Follow-up stroke  EXAM: CT HEAD WITHOUT CONTRAST  TECHNIQUE:  Contiguous axial images were obtained from the base of the skull through the vertex without intravenous contrast.  COMPARISON:  Prior  study from 11/18/2013  FINDINGS: There has been interval evolution of previously identified large right MCA territory infarct, overall stable in size and distribution as compared to prior studies. Infarcted territory involves the right frontal, parietal, and temporal lobes. No evidence of hemorrhagic conversion. There is increased right to left subfalcine herniation, now measuring approximately 1 cm, previously 5 mm. Right lateral ventricles is partially effaced. Asymmetric enlargement of the left lateral ventricle is slightly increased. Third and fourth ventricular size is stable. Increased density within the right M1 segment may represent thrombus.  No new large vessel territory infarct identified. No mass lesion. No extra-axial fluid collection. Calvarium remains intact. Globes are within normal limits.  Paranasal sinuses and mastoid air cells are clear.  Atherosclerotic disease again noted within the carotid siphons.  IMPRESSION: 1. Continued interval evolution of large right MCA territory ischemic infarct with increased right-to-left subfalcine herniation, now measuring 1 cm, previously 5 mm on 11/18/2013. Asymmetric enlargement of the left lateral ventricle is slightly increased, worrisome for possible developing entrapment. 2. No evidence of hemorrhagic conversion. Critical Value/emergent results were called by telephone at the time of interpretation on 11/20/2013 at 4:48 AM to the critical care nurse Ripley Fraise, who verbally acknowledged these results.   Electronically Signed   By: Rise Mu M.D.   On: 11/20/2013 04:52   Ct Head Wo Contrast  11/18/2013   CLINICAL DATA:  Ectasia, right static days, no movement on the left side of the body  EXAM: CT HEAD WITHOUT CONTRAST  TECHNIQUE: Contiguous axial images were obtained from the base of the skull through the  vertex without intravenous contrast.  COMPARISON:  None.  FINDINGS: There is no evidence of mass effect, midline shift, or extra-axial fluid collections. There is no evidence of a space-occupying lesion or intracranial hemorrhage. There is a large area of low attenuation in the right frontal, temporal and parietal lobe with loss of the normal gray-white differentiation most consistent with an acute -subacute infarction. There is a hyperdense right MCA concerning for thrombus. There is generalized cerebral atrophy. There is periventricular white matter low attenuation likely secondary to microangiopathy.  The ventricles and sulci are appropriate for the patient's age. The basal cisterns are patent.  Visualized portions of the orbits are unremarkable. The visualized portions of the paranasal sinuses and mastoid air cells are unremarkable. Cerebrovascular atherosclerotic calcifications are noted.  The osseous structures are unremarkable.  IMPRESSION: 1. 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 There is a hyperdense right MCA concerning for thrombus. Critical Value/emergent results were called by telephone at the time of interpretation on 11/18/2013 at 12:25 PM to Dr. Melene Plan , who verbally acknowledged these results.   Electronically Signed   By: Elige Ko   On: 11/18/2013 12:27   Ct Angio Neck W/cm &/or Wo/cm  11/18/2013   CLINICAL DATA:  Stroke could, followup.  EXAM: CT ANGIOGRAPHY HEAD AND NECK  TECHNIQUE: Multidetector CT imaging of the head and neck was performed using the standard protocol during bolus administration of intravenous contrast. Multiplanar CT image reconstructions including MIPs were obtained to evaluate the vascular anatomy. Carotid stenosis measurements (when applicable) are obtained utilizing NASCET criteria, using the distal internal carotid diameter as the denominator.  CONTRAST:  80mL OMNIPAQUE IOHEXOL 350 MG/ML SOLN  COMPARISON:  CT of the head November 18 2013  at 1204 pm  FINDINGS: CTA HEAD FINDINGS  Anterior circulation: The petrous, cavernous and supra clinoid internal carotid arteries appear widely patent with moderate calcific atherosclerosis. Poor  visualization of the A1 segments bilaterally. Robust left M1 segment, however there is poor visualization of the right M1 segment, with hazy appearance, which may be accentuated by venous contamination. There is overall attenuated appearance of the right M2 and M3 branches. There is suspected filling defect of the carotid terminus propagating to the proximal right M1 segment, as well as the right A1 segment (coronal 114/229). Patent anterior communicating artery with robust appearance of the bilateral anterior cerebral arteries and more distal branches.  Posterior circulation: Normal appearance of the vertebrobasilar junction, however the left vertebral artery appears to predominately terminate in the left posterior inferior cerebellar artery. Robust bilateral posterior communicating arteries with normal appearance of posterior cerebral arteries.  No aneurysm.  No suspicious luminal irregularity.  Large right middle cerebral artery territory infarct again noted, with 5 mm of right to left subfalcine herniation, increased. Increasing enlargement of the left ventricle atrium concerning for entrapment.  Review of the MIP images confirms the above findings.  CTA NECK FINDINGS  Normal 3 vessel aortic arch with minimal calcific atherosclerosis of the left subclavian artery origin. Widely patent origin of bilateral common carotid arteries. Bilateral common carotid arteries course in a straight line fashion, with up to 2 mm of eccentric calcific atherosclerosis of the carotid bifurcations/ internal carotid artery origins without hemodynamically significant stenosis. Medialized course of the cervical internal carotid arteries, may be transient. Right tonsillar loop, with tortuous course, to a lesser extent on the left. The bilateral  cervical internal carotid arteries are widely patent.  Due to streak artifact from retained contrast in the low left subclavian vein, limited evaluation of origin of left vertebral artery. 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 Bilateral vertebral arteries are normal course and caliber, the left vertebral artery predominately terminates in the left posterior-inferior cerebellar artery.  Left internal jugular central venous catheter with distal tip at brachiocephalic confluence. Included view of the chest demonstrates apparent mediastinal lymphadenopathy, partially characterized. Airways patent. Patient is edentulous. Degenerative change of hte cervical spine, incompletely characterized.  Review of the MIP images confirms the above findings.  IMPRESSION: CTA head: Poor visualization of the right carotid terminus concerning for embolus propagating into the proximal right M1 and right A1 segments, however there is reconstitution distally suggesting nonocclusive embolus or early re- cannulization. Mildly attenuated right M2 and M3 appearance which may be in part due to compressive changes from right cerebral edema and evolving right MCA territory infarct. Worsening right cerebral edema, increasing right to left subfalcine herniation.  Complete circle of Willis.  CTA neck:  No hemodynamically significant stenosis.  Suspected mediastinal lymphadenopathy, partially imaged.  Preliminary findings discussed by Dr. Miles Costain and confirmed by Dr. Danielle Dess on November 18, 2013 at approximately 2200 hr.   Electronically Signed   By: Awilda Metro   On: 11/18/2013 22:49   Dg Chest Port 1 View  12/15/2013   CLINICAL DATA:  Shortness of breath.  Sepsis.  Atrial fibrillation.  EXAM: PORTABLE CHEST - 1 VIEW  COMPARISON:  12/13/13  FINDINGS: Low lung volumes again seen. Bibasilar atelectasis again demonstrated. No definite pleural effusion seen. Heart size is stable.   IMPRESSION: Persistent low lung volumes with bibasilar atelectasis.   Electronically Signed   By: Myles Rosenthal M.D.   On: 12/15/2013 07:25   Dg Chest Port 1 View  12/13/2013   CLINICAL DATA:  Aspiration pneumonia  EXAM: PORTABLE CHEST - 1 VIEW  COMPARISON:  DG CHEST 1V PORT dated 12/10/2013  FINDINGS: Grossly unchanged enlarged cardiac silhouette and mediastinal contours given reduced lung volumes antibiotic projection. Worsening right perihilar and left basilar heterogeneous possible airspace opacities. There is persistent mild elevation the right hemidiaphragm. Trace left-sided effusion is not excluded. No definite evidence of edema. No definite pneumothorax. Grossly unchanged bones.  IMPRESSION: Decreased lung volumes with worsening right perihilar and left basilar opacities, while possibly atelectasis worrisome for multifocal infection/aspiration. Further evaluation with a PA and lateral chest radiograph may be obtained as clinically indicated.   Electronically Signed   By: Simonne Come M.D.   On: 12/13/2013 07:59   Dg Chest Port 1 View  12/10/2013   CLINICAL DATA:  Short of breath on congestion  EXAM: PORTABLE CHEST - 1 VIEW  COMPARISON:  None.  FINDINGS: Cardiac silhouette is mildly enlarged. There is perihilar a fine interstitial pattern suggesting interstitial edema. No focal consolidation. No pneumothorax.  IMPRESSION: Cardiomegaly and mild interstitial edema pattern   Electronically Signed   By: Genevive Bi M.D.   On: 12/10/2013 21:45   Dg Chest Port 1 View  11/21/2013   CLINICAL DATA:  Dyspnea  EXAM: PORTABLE CHEST - 1 VIEW  COMPARISON:  DG CHEST 1V PORT dated 11/19/2013  FINDINGS: The lungs are adequately inflated. The interstitial markings have become less conspicuous since yesterday's study. The retrocardiac region on the left remains dense. The cardiopericardial silhouette remains mildly enlarged. The pulmonary vascularity is less prominent. The left internal jugular venous catheter tip lies  in the region of the proximal SVC. The enteric feeding tube tip projects off the inferior margin of the image.  IMPRESSION: There has been improvement in the appearance of the pulmonary interstitium since yesterday's study consistent with resolving pulmonary edema. There is persistent atelectasis or pneumonia in the left lower lobe. There is no pneumothorax.  These results will be called to the ordering clinician or representative by the Radiologist Assistant, and communication documented in the PACS Dashboard.   Electronically Signed   By: David  [place]   On: 11/21/2013 13:21   Dg Chest Port 1 View  11/19/2013   CLINICAL DATA:  Airspace disease, followup  EXAM: PORTABLE CHEST - 1 VIEW airspace disease, followup, history hypertension, diabetes  COMPARISON:  Portable exam 0537 hr compared to 11/18/2013  FINDINGS: Left jugular line stable tip projecting over brachiocephalic vein confluence with SVC.  Enlargement of cardiac silhouette.  Pulmonary vascular congestion.  Diffuse bilateral pulmonary infiltrates favor pulmonary edema over infection, unchanged.  No gross pleural effusion or pneumothorax.  IMPRESSION: Persistent pulmonary infiltrates question pulmonary edema, little changed.   Electronically Signed   By: Ulyses Southward M.D.   On: 11/19/2013 07:53   Dg Chest Port 1 View  11/18/2013   CLINICAL DATA:  Central line placement.  EXAM: PORTABLE CHEST - 1 VIEW  COMPARISON:  11/18/2013, 1051 hr  FINDINGS: Film at 1728 hr demonstrates placement of a left jugular central line with the tip transversely oriented at the brachiocephalic venous confluence/origin of SVC. No pneumothorax is identified. Lungs again demonstrate edema. The heart is enlarged.  IMPRESSION: Central line tip at the brachiocephalic venous confluence. No pneumothorax is identified.   Electronically Signed   By: Irish Lack M.D.   On: 11/18/2013 17:57   Dg Chest Portable 1 View  11/18/2013   CLINICAL DATA:  Weakness and altered mental status.   EXAM: PORTABLE CHEST - 1 VIEW  COMPARISON:  None.  FINDINGS: The lungs are borderline hypoinflated. The interstitial markings are increased bilaterally and are nearly  confluent in some areas on the left. No air bronchograms are demonstrated. The cardiac silhouette is enlarged. The pulmonary vascularity is engorged and indistinct. There is no pleural effusion or pneumothorax. The observed portions of the bony thorax appear normal.  IMPRESSION: The findings suggest congestive heart failure with pulmonary interstitial and early alveolar edema.   Electronically Signed   By: David  [place]   On: 11/18/2013 11:22   Dg Abd Portable 1v  11/23/2013   CLINICAL DATA:  Bedside feeding tube placement.  EXAM: PORTABLE ABDOMEN - 1 VIEW  COMPARISON:  DG ABD PORTABLE 1V dated 11/21/2013  FINDINGS: Motion blurs the image, though I can tell that the feeding tube tip is projecting at the expected location of the gastric fundus. Visualized bowel gas pattern unremarkable.  IMPRESSION: Feeding tube tip in the fundus of the stomach.   Electronically Signed   By: Hulan Saas M.D.   On: 11/23/2013 18:51   Dg Abd Portable 1v  11/21/2013   CLINICAL DATA:  Feeding tube  EXAM: PORTABLE ABDOMEN - 1 VIEW  COMPARISON:  None.  FINDINGS: Feeding tube identified looped within the stomach with tip in the region of the cardia of the stomach. Nonobstructive gas pattern.  IMPRESSION: Feeding tube looped within the stomach.   Electronically Signed   By: Esperanza Heir M.D.   On: 11/21/2013 13:23    Jeoffrey Massed, MD  [facility] Pager:[phone]  If 7PM-7AM, please contact night-coverage [email or web address] Password [email or web address] 12/18/2013, 10:13 AM   LOS: 8 days

## 2013-12-18 NOTE — Progress Notes (Signed)
Spoke with MD. IV may remain intact/ does not need changed. IV remains [place] locked, pt. Is a possible discharge tomorrow.

## 2013-12-18 NOTE — Progress Notes (Signed)
Nutrition Brief Note  Chart reviewed. Pt now transitioning to comfort care. Discussed pt during progression rounds, enteral nutrition has been discontinued.  No further nutrition interventions warranted at this time.  Please re-consult as needed.   Jarold Motto MS, RD, LDN Inpatient Registered Dietitian Pager: [phone] After-hours pager: [phone]

## 2013-12-19 NOTE — Progress Notes (Signed)
PATIENT DETAILS Name: Todd Vargas Age: 68 y.o. Sex: male Date of Birth: Aug 14, 1946 Admit Date: 12/10/2013 Admitting Physician Hillary Bow, DO WUJ:[id number],NFAOZ, MD  Subjective: Stable overnight.  Assessment/Plan: Sepsis - multiple sources  -related to urinary tract infection + aspiration healthcare acquired PNA  - Have transitioned to a full comfort measures, all antibiotics have been subsequently discontinued.  Acute hypoxemic respiratory failure / R perihilar and L basilar HCAP DUE to aspiration  -after hydration patient develped thick yellow brown respiratory secretions suggesting HCAP vs aspiration pneumonia  -Now supportive care only   UTI  -Urinalysis highly abnormal - culture was not able to isolate a single offending organism - empiric abx were dosed but clinical condition did not improve so they were stopped.   Metabolic encephalopathy  -Likely related to sepsis and infectious processes, in setting of recent devastating CVA  -EEG revealed hemispheric dysfunction which correlates with patient's known history of ischemic stroke - no seizures noted  -mental status has minimally improved but unlikely to regain any quality of life.   Atrial fibrillation with RVR  - Transitioning to comfort measures-continue with metoprolol as long as possible, stop all anticoagulation  Diabetes mellitus type 2, uncontrolled  Supportive care only, goal of care now comfort   Hypertension  -well controlled at present- continue with metoprolol rate control for A. fib-this should adequately control his blood pressure. Continue as long as possible.  Dehydration/Hypernatremia  -now resolved   Chronic systolic heart failure  -Compensated - EF 35-40% via TTE 11/20/2013   Dysphagia / PEG dependent  -Now stopped as patient is hospice per previous care team  History of large ischemic stroke with residual hemiparesis  -long term prognosis poor   Disposition: [facility]  today  DVT Prophylaxis: Not needed-comfort care  Code Status: DNR  Family Communication None at bedside  Procedures:  None  CONSULTS:  Palliative care   MEDICATIONS: Scheduled Meds: . antiseptic oral rinse  15 mL Mouth Rinse q12n4p  . chlorhexidine  15 mL Mouth Rinse BID  . diltiazem  30 mg Per Tube 4 times per day  . metoprolol tartrate  50 mg Per Tube BID   Continuous Infusions:  PRN Meds:.acetaminophen (TYLENOL) oral liquid 160 mg/5 mL, free water, LORazepam, ondansetron (ZOFRAN) IV, oxyCODONE, sorbitol  Antibiotics: Anti-infectives   Start     Dose/Rate Route Frequency Ordered Stop   12/13/13 0500  vancomycin (VANCOCIN) 1,250 mg in sodium chloride 0.9 % 250 mL IVPB  Status:  Discontinued     1,250 mg 166.7 mL/hr over 90 Minutes Intravenous Every 8 hours 12/12/13 1550 12/14/13 1138   12/12/13 1700  vancomycin (VANCOCIN) 2,000 mg in sodium chloride 0.9 % 500 mL IVPB     2,000 mg 250 mL/hr over 120 Minutes Intravenous  Once 12/12/13 1550 12/13/13 0037   12/12/13 1630  piperacillin-tazobactam (ZOSYN) IVPB 3.375 g  Status:  Discontinued     3.375 g 12.5 mL/hr over 240 Minutes Intravenous 3 times per day 12/12/13 1618 12/16/13 1716   12/12/13 1600  levofloxacin (LEVAQUIN) IVPB 750 mg  Status:  Discontinued     750 mg 100 mL/hr over 90 Minutes Intravenous Every 24 hours 12/12/13 1547 12/12/13 1612   12/12/13 1000  fluconazole (DIFLUCAN) IVPB 100 mg     100 mg 50 mL/hr over 60 Minutes Intravenous Every 24 hours 12/12/13 0835 12/14/13 1028   12/12/13 0800  cefTRIAXone (ROCEPHIN) 1 g in dextrose 5 % 50 mL  IVPB  Status:  Discontinued     1 g 100 mL/hr over 30 Minutes Intravenous Every 24 hours 12/11/13 0009 12/11/13 0234   12/11/13 1000  vancomycin (VANCOCIN) 1,250 mg in sodium chloride 0.9 % 250 mL IVPB  Status:  Discontinued     1,250 mg 166.7 mL/hr over 90 Minutes Intravenous Every 12 hours 12/10/13 2143 12/11/13 0009   12/11/13 1000  cefTRIAXone (ROCEPHIN) 1 g in  dextrose 5 % 50 mL IVPB  Status:  Discontinued     1 g 100 mL/hr over 30 Minutes Intravenous Every 24 hours 12/11/13 0234 12/12/13 1502   12/11/13 0400  piperacillin-tazobactam (ZOSYN) IVPB 3.375 g  Status:  Discontinued     3.375 g 12.5 mL/hr over 240 Minutes Intravenous Every 8 hours 12/10/13 2143 12/11/13 0009   12/10/13 2145  piperacillin-tazobactam (ZOSYN) IVPB 3.375 g     3.375 g 100 mL/hr over 30 Minutes Intravenous  Once 12/10/13 2130 12/11/13 0033   12/10/13 2145  vancomycin (VANCOCIN) IVPB 1000 mg/200 mL premix  Status:  Discontinued     1,000 mg 200 mL/hr over 60 Minutes Intravenous  Once 12/10/13 2130 12/10/13 2137   12/10/13 2145  vancomycin (VANCOCIN) 2,000 mg in sodium chloride 0.9 % 500 mL IVPB     2,000 mg 250 mL/hr over 120 Minutes Intravenous  Once 12/10/13 2137 12/11/13 0056       PHYSICAL EXAM: Vital signs in last 24 hours: Filed Vitals:   12/18/13 1500 12/18/13 2119 12/19/13 0620 12/19/13 0951  BP: 137/70 147/66 166/87 164/75  Pulse: 70 75 94 80  Temp: 97.4 F (36.3 C) 98.5 F (36.9 C) 98.5 F (36.9 C)   TempSrc: Oral Oral Oral   Resp: 18 20 18    Height:      Weight:      SpO2: 99% 99% 100%     Weight change:  Filed Weights   12/11/13 0247 12/16/13 0400 12/16/13 2053  Weight: 119.2 kg (262 lb 12.6 oz) 115.8 kg (255 lb 4.7 oz) 112.6 kg (248 lb 3.8 oz)   Body mass index is 33.66 kg/(m^2).   Gen Exam: Awake,not responding-left sided neglect Neck: Supple, No Chest: B/L Clear.  CVS: S1 S2 Regular, no murmurs.  Abdomen: soft, BS +, non tender, non distended.  Extremities: no edema, lower extremities warm to touch.  Intake/Output from previous day: No intake or output data in the 24 hours ending 12/19/13 1004   LAB RESULTS: CBC  Recent Labs Lab 12/13/13 0332 12/14/13 0307  WBC 9.7 7.9  HGB 11.8* 12.0*  HCT 37.9* 37.4*  PLT 89* 88*  MCV 82.6 80.4  MCH 25.7* 25.8*  MCHC 31.1 32.1  RDW 15.1 14.6    Chemistries   Recent Labs Lab  12/13/13 0332 12/14/13 0307  NA 149* 143  K 4.0 3.6*  CL 113* 108  CO2 24 22  GLUCOSE 204* 193*  BUN 20 13  CREATININE 0.76 0.70  CALCIUM 8.7 8.5    CBG:  Recent Labs Lab 12/15/13 1831 12/15/13 2352 12/16/13 0637 12/16/13 1247 12/16/13 2014  GLUCAP 304* 312* 285* 258* 236*    GFR Estimated Creatinine Clearance: 114.5 ml/min (by C-G formula based on Cr of 0.7).  Coagulation profile No results found for this basename: INR, PROTIME,  in the last 168 hours  Cardiac Enzymes No results found for this basename: CK, CKMB, TROPONINI, MYOGLOBIN,  in the last 168 hours  No components found with this basename: POCBNP,  No results found for this  basename: DDIMER,  in the last 72 hours No results found for this basename: HGBA1C,  in the last 72 hours No results found for this basename: CHOL, HDL, LDLCALC, TRIG, CHOLHDL, LDLDIRECT,  in the last 72 hours No results found for this basename: TSH, T4TOTAL, FREET3, T3FREE, THYROIDAB,  in the last 72 hours No results found for this basename: VITAMINB12, FOLATE, FERRITIN, TIBC, IRON, RETICCTPCT,  in the last 72 hours No results found for this basename: LIPASE, AMYLASE,  in the last 72 hours  Urine Studies No results found for this basename: UACOL, UAPR, USPG, UPH, UTP, UGL, UKET, UBIL, UHGB, UNIT, UROB, ULEU, UEPI, UWBC, URBC, UBAC, CAST, CRYS, UCOM, BILUA,  in the last 72 hours  MICROBIOLOGY: Recent Results (from the past 240 hour(s))  CULTURE, BLOOD (ROUTINE X 2)     Status: None   Collection Time    12/10/13  9:40 PM      Result Value Ref Range Status   Specimen Description BLOOD RIGHT ARM   Final   Special Requests BOTTLES DRAWN AEROBIC AND ANAEROBIC   Final   Culture  Setup Time     Final   Value: 12/11/2013 03:27     Performed at [facility]   Culture     Final   Value: NO GROWTH 5 DAYS     Performed at [facility]   Report Status 12/17/2013 FINAL   Final  CULTURE, BLOOD (ROUTINE X 2)     Status:  None   Collection Time    12/10/13  9:45 PM      Result Value Ref Range Status   Specimen Description BLOOD RIGHT HAND   Final   Special Requests BOTTLES DRAWN AEROBIC ONLY 10CC   Final   Culture  Setup Time     Final   Value: 12/11/2013 03:40     Performed at [facility]   Culture     Final   Value: STAPHYLOCOCCUS SPECIES (COAGULASE NEGATIVE)     Note: THE SIGNIFICANCE OF ISOLATING THIS ORGANISM FROM A SINGLE SET OF BLOOD CULTURES WHEN MULTIPLE SETS ARE DRAWN IS UNCERTAIN. PLEASE NOTIFY THE MICROBIOLOGY DEPARTMENT WITHIN ONE WEEK IF SPECIATION AND SENSITIVITIES ARE REQUIRED.     Note: Gram Stain Report Called to,Read Back By and Verified With: [facility] RN AT [phone] BY CASTILLOC     Performed at [facility]   Report Status 12/14/2013 FINAL   Final  URINE CULTURE     Status: None   Collection Time    12/10/13  9:52 PM      Result Value Ref Range Status   Specimen Description URINE, RANDOM   Final   Special Requests NONE   Final   Culture  Setup Time     Final   Value: 12/11/2013 03:43     Performed at [facility] Count     Final   Value: >=100,000 COLONIES/ML     Performed at [facility]   Culture     Final   Value: Multiple bacterial morphotypes present, none predominant. Suggest appropriate recollection if clinically indicated.     Performed at [facility]   Report Status 12/12/2013 FINAL   Final    RADIOLOGY STUDIES/RESULTS: Ct Angio Head W/cm &/or Wo Cm  11/18/2013   CLINICAL DATA:  Stroke could, followup.  EXAM: CT ANGIOGRAPHY HEAD AND NECK  TECHNIQUE: Multidetector CT imaging of the head and neck was performed using the  standard protocol during bolus administration of intravenous contrast. Multiplanar CT image reconstructions including MIPs were obtained to evaluate the vascular anatomy. Carotid stenosis measurements (when applicable) are obtained utilizing NASCET criteria, using the distal internal carotid diameter as  the denominator.  CONTRAST:  80mL OMNIPAQUE IOHEXOL 350 MG/ML SOLN  COMPARISON:  CT of the head November 18 2013 at 1204 pm  FINDINGS: CTA HEAD FINDINGS  Anterior circulation: The petrous, cavernous and supra clinoid internal carotid arteries appear widely patent with moderate calcific atherosclerosis. Poor visualization of the A1 segments bilaterally. Robust left M1 segment, however there is poor visualization of the right M1 segment, with hazy appearance, which may be accentuated by venous contamination. There is overall attenuated appearance of the right M2 and M3 branches. There is suspected filling defect of the carotid terminus propagating to the proximal right M1 segment, as well as the right A1 segment (coronal 114/229). Patent anterior communicating artery with robust appearance of the bilateral anterior cerebral arteries and more distal branches.  Posterior circulation: Normal appearance of the vertebrobasilar junction, however the left vertebral artery appears to predominately terminate in the left posterior inferior cerebellar artery. Robust bilateral posterior communicating arteries with normal appearance of posterior cerebral arteries.  No aneurysm.  No suspicious luminal irregularity.  Large right middle cerebral artery territory infarct again noted, with 5 mm of right to left subfalcine herniation, increased. Increasing enlargement of the left ventricle atrium concerning for entrapment.  Review of the MIP images confirms the above findings.  CTA NECK FINDINGS  Normal 3 vessel aortic arch with minimal calcific atherosclerosis of the left subclavian artery origin. Widely patent origin of bilateral common carotid arteries. Bilateral common carotid arteries course in a straight line fashion, with up to 2 mm of eccentric calcific atherosclerosis of the carotid bifurcations/ internal carotid artery origins without hemodynamically significant stenosis. Medialized course of the cervical internal carotid  arteries, may be transient. Right tonsillar loop, with tortuous course, to a lesser extent on the left. The bilateral cervical internal carotid arteries are widely patent.  Due to streak artifact from retained contrast in the low left subclavian vein, limited evaluation of origin of left vertebral artery. 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 Bilateral vertebral arteries are normal course and caliber, the left vertebral artery predominately terminates in the left posterior-inferior cerebellar artery.  Left internal jugular central venous catheter with distal tip at brachiocephalic confluence. Included view of the chest demonstrates apparent mediastinal lymphadenopathy, partially characterized. Airways patent. Patient is edentulous. Degenerative change of hte cervical spine, incompletely characterized.  Review of the MIP images confirms the above findings.  IMPRESSION: CTA head: Poor visualization of the right carotid terminus concerning for embolus propagating into the proximal right M1 and right A1 segments, however there is reconstitution distally suggesting nonocclusive embolus or early re- cannulization. Mildly attenuated right M2 and M3 appearance which may be in part due to compressive changes from right cerebral edema and evolving right MCA territory infarct. Worsening right cerebral edema, increasing right to left subfalcine herniation.  Complete circle of Willis.  CTA neck:  No hemodynamically significant stenosis.  Suspected mediastinal lymphadenopathy, partially imaged.  Preliminary findings discussed by Dr. Miles CostainShick and confirmed by Dr. Danielle DessElsner on November 18, 2013 at approximately 2200 hr.   Electronically Signed   By: Awilda Metroourtnay  Bloomer   On: 11/18/2013 22:49   Ct Head Wo Contrast  12/11/2013   CLINICAL DATA:  Altered mental status.  EXAM: CT HEAD WITHOUT CONTRAST  TECHNIQUE: Contiguous axial images were obtained from the base of the skull  through the vertex without intravenous contrast.  COMPARISON:  CT of the head November 23, 2013.  FINDINGS: Moderately motion degraded examination. Right frontotemporal parietal wedge-like hypodensity consistent with right middle cerebral artery territory infarct again noted including right basal ganglia involvement, there is overall associated last mass effect, with minimal petechial hemorrhage. Mild ex vacuo dilatation right lateral ventricle now seen. Mild asymmetric left cerebellar volume loss may reflect early across cerebellar diaschisis. No acute large vascular territory infarct.  No hydrocephalus, re-expanded right lateral ventricle ; moderate ventriculomegaly, likely on the basis of global parenchymal brain volume loss as there is overall commensurate enlargement of cerebral sulci and cerebellar folia. Again noticed patchy hypodensities within the left thalamus most consistent with remote lacunar infarcts in addition to confluent supratentorial white matter hypodensities exclusive of the right middle cerebral artery territory infarct most consistent with chronic small vessel ischemic disease. Moderate calcific atherosclerosis of the carotid siphons.  Patient is edentulous. Atretic maxillary sinuses suggest sequela chronic sinusitis without acute component. Mastoid air cells are well aerated. Soft tissue within the right external auditory canal likely reflects cerumen. No skull fracture. Ocular globes and orbital contents are nonsuspicious.  IMPRESSION: Subacute to early chronic right middle cerebral artery territory infarct with re-expanded right lateral ventricle, no residual entrapment or hydrocephalus. No acute intracranial process.  Severe white matter changes suggest chronic small vessel ischemic disease with remote left thalamus lacunar infarcts.   Electronically Signed   By: Awilda Metro   On: 12/11/2013 00:19   Ct Head Wo Contrast  11/23/2013   CLINICAL DATA:  Followup right middle cerebral  artery stroke.  EXAM: CT HEAD WITHOUT CONTRAST  TECHNIQUE: Contiguous axial images were obtained from the base of the skull through the vertex without intravenous contrast.  COMPARISON:  CT of the head November 20, 2013  FINDINGS: Right frontotemporal parietal wedge-like hypodensity again noted with sulcal effacement. 7 mm of right to left midline shift (previously 10 mm), with mild re-expansion of the right lateral ventricle. However there is mild, though improved, dilatation of left lateral ventricle atrium, with minimal transependymal flow cerebral spinal fluid. No intraparenchymal hemorrhage. Patchy supratentorial white matter hypodensities exclusive of the right middle cerebral artery territory stroke. Patchy left subthalamic hypodensities are unchanged likely reflecting rib fluid coronary infarcts.  Basal cisterns are patent. Severe calcific atherosclerosis of the carotid siphons. Patient is edentulous. No skull fracture. Atretic right greater than left maxillary sinuses may reflect chronic sinusitis without acute component. Visualized mastoid air cells are well aerated. Included ocular globes and orbital contents are unremarkable though not tailored for evaluation.  IMPRESSION: Evolving right middle cerebral artery territory infarct with overall decreased mass effect, no hemorrhagic conversion. Decreasing entrapment with transependymal flow cerebral spinal fluid, improved.  Moderate white matter changes suggest chronic small vessel ischemic disease, in addition to remote left lacunar infarct.   Electronically Signed   By: Awilda Metro   On: 11/23/2013 07:05   Ct Head Wo Contrast  11/20/2013   CLINICAL DATA:  Follow-up stroke  EXAM: CT HEAD WITHOUT CONTRAST  TECHNIQUE: Contiguous axial images were obtained from the base of the skull through the vertex without intravenous contrast.  COMPARISON:  Prior study from 11/18/2013  FINDINGS: There has been interval evolution of previously identified large right  MCA territory infarct, overall stable in size and distribution as compared to prior studies. Infarcted territory involves the right frontal, parietal, and temporal lobes. No  evidence of hemorrhagic conversion. There is increased right to left subfalcine herniation, now measuring approximately 1 cm, previously 5 mm. Right lateral ventricles is partially effaced. Asymmetric enlargement of the left lateral ventricle is slightly increased. Third and fourth ventricular size is stable. Increased density within the right M1 segment may represent thrombus.  No new large vessel territory infarct identified. No mass lesion. No extra-axial fluid collection. Calvarium remains intact. Globes are within normal limits.  Paranasal sinuses and mastoid air cells are clear.  Atherosclerotic disease again noted within the carotid siphons.  IMPRESSION: 1. Continued interval evolution of large right MCA territory ischemic infarct with increased right-to-left subfalcine herniation, now measuring 1 cm, previously 5 mm on 11/18/2013. Asymmetric enlargement of the left lateral ventricle is slightly increased, worrisome for possible developing entrapment. 2. No evidence of hemorrhagic conversion. Critical Value/emergent results were called by telephone at the time of interpretation on 11/20/2013 at 4:48 AM to the critical care nurse Ripley Fraise, who verbally acknowledged these results.   Electronically Signed   By: Rise Mu M.D.   On: 11/20/2013 04:52   Ct Head Wo Contrast  11/18/2013   CLINICAL DATA:  Ectasia, right static days, no movement on the left side of the body  EXAM: CT HEAD WITHOUT CONTRAST  TECHNIQUE: Contiguous axial images were obtained from the base of the skull through the vertex without intravenous contrast.  COMPARISON:  None.  FINDINGS: There is no evidence of mass effect, midline shift, or extra-axial fluid collections. There is no evidence of a space-occupying lesion or intracranial hemorrhage. There is a  large area of low attenuation in the right frontal, temporal and parietal lobe with loss of the normal gray-white differentiation most consistent with an acute -subacute infarction. There is a hyperdense right MCA concerning for thrombus. There is generalized cerebral atrophy. There is periventricular white matter low attenuation likely secondary to microangiopathy.  The ventricles and sulci are appropriate for the patient's age. The basal cisterns are patent.  Visualized portions of the orbits are unremarkable. The visualized portions of the paranasal sinuses and mastoid air cells are unremarkable. Cerebrovascular atherosclerotic calcifications are noted.  The osseous structures are unremarkable.  IMPRESSION: 1. 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 There is a hyperdense right MCA concerning for thrombus. Critical Value/emergent results were called by telephone at the time of interpretation on 11/18/2013 at 12:25 PM to Dr. Melene Plan , who verbally acknowledged these results.   Electronically Signed   By: Elige Ko   On: 11/18/2013 12:27   Ct Angio Neck W/cm &/or Wo/cm  11/18/2013   CLINICAL DATA:  Stroke could, followup.  EXAM: CT ANGIOGRAPHY HEAD AND NECK  TECHNIQUE: Multidetector CT imaging of the head and neck was performed using the standard protocol during bolus administration of intravenous contrast. Multiplanar CT image reconstructions including MIPs were obtained to evaluate the vascular anatomy. Carotid stenosis measurements (when applicable) are obtained utilizing NASCET criteria, using the distal internal carotid diameter as the denominator.  CONTRAST:  80mL OMNIPAQUE IOHEXOL 350 MG/ML SOLN  COMPARISON:  CT of the head November 18 2013 at 1204 pm  FINDINGS: CTA HEAD FINDINGS  Anterior circulation: The petrous, cavernous and supra clinoid internal carotid arteries appear widely patent with moderate calcific atherosclerosis. Poor visualization of the A1 segments bilaterally.  Robust left M1 segment, however there is poor visualization of the right M1 segment, with hazy appearance, which may be accentuated by venous contamination. There is overall attenuated appearance of the right M2 and  M3 branches. There is suspected filling defect of the carotid terminus propagating to the proximal right M1 segment, as well as the right A1 segment (coronal 114/229). Patent anterior communicating artery with robust appearance of the bilateral anterior cerebral arteries and more distal branches.  Posterior circulation: Normal appearance of the vertebrobasilar junction, however the left vertebral artery appears to predominately terminate in the left posterior inferior cerebellar artery. Robust bilateral posterior communicating arteries with normal appearance of posterior cerebral arteries.  No aneurysm.  No suspicious luminal irregularity.  Large right middle cerebral artery territory infarct again noted, with 5 mm of right to left subfalcine herniation, increased. Increasing enlargement of the left ventricle atrium concerning for entrapment.  Review of the MIP images confirms the above findings.  CTA NECK FINDINGS  Normal 3 vessel aortic arch with minimal calcific atherosclerosis of the left subclavian artery origin. Widely patent origin of bilateral common carotid arteries. Bilateral common carotid arteries course in a straight line fashion, with up to 2 mm of eccentric calcific atherosclerosis of the carotid bifurcations/ internal carotid artery origins without hemodynamically significant stenosis. Medialized course of the cervical internal carotid arteries, may be transient. Right tonsillar loop, with tortuous course, to a lesser extent on the left. The bilateral cervical internal carotid arteries are widely patent.  Due to streak artifact from retained contrast in the low left subclavian vein, limited evaluation of origin of left vertebral artery. Diminutive right vertebral artery suggest congenital  variant, with dominant left vertebral artery. Bilateral vertebral arteries are normal course and caliber, the left vertebral artery predominately terminates in the left posterior-inferior cerebellar artery.  Left internal jugular central venous catheter with distal tip at brachiocephalic confluence. Included view of the chest demonstrates apparent mediastinal lymphadenopathy, partially characterized. Airways patent. Patient is edentulous. Degenerative change of hte cervical spine, incompletely characterized.  Review of the MIP images confirms the above findings.  IMPRESSION: CTA head: Poor visualization of the right carotid terminus concerning for embolus propagating into the proximal right M1 and right A1 segments, however there is reconstitution distally suggesting nonocclusive embolus or early re- cannulization. Mildly attenuated right M2 and M3 appearance which may be in part due to compressive changes from right cerebral edema and evolving right MCA territory infarct. Worsening right cerebral edema, increasing right to left subfalcine herniation.  Complete circle of Willis.  CTA neck:  No hemodynamically significant stenosis.  Suspected mediastinal lymphadenopathy, partially imaged.  Preliminary findings discussed by Dr. Miles Costain and confirmed by Dr. Danielle Dess on November 18, 2013 at approximately 2200 hr.   Electronically Signed   By: Awilda Metro   On: 11/18/2013 22:49   Dg Chest Port 1 View  12/15/2013   CLINICAL DATA:  Shortness of breath.  Sepsis.  Atrial fibrillation.  EXAM: PORTABLE CHEST - 1 VIEW  COMPARISON:  12/13/13  FINDINGS: Low lung volumes again seen. Bibasilar atelectasis again demonstrated. No definite pleural effusion seen. Heart size is stable.  IMPRESSION: Persistent low lung volumes with bibasilar atelectasis.   Electronically Signed   By: Myles Rosenthal M.D.   On: 12/15/2013 07:25   Dg Chest Port 1 View  12/13/2013   CLINICAL DATA:  Aspiration pneumonia  EXAM: PORTABLE CHEST - 1 VIEW   COMPARISON:  DG CHEST 1V PORT dated 12/10/2013  FINDINGS: Grossly unchanged enlarged cardiac silhouette and mediastinal contours given reduced lung volumes antibiotic projection. Worsening right perihilar and left basilar heterogeneous possible airspace opacities. There is persistent mild elevation the right hemidiaphragm. Trace left-sided effusion is not excluded.  No definite evidence of edema. No definite pneumothorax. Grossly unchanged bones.  IMPRESSION: Decreased lung volumes with worsening right perihilar and left basilar opacities, while possibly atelectasis worrisome for multifocal infection/aspiration. Further evaluation with a PA and lateral chest radiograph may be obtained as clinically indicated.   Electronically Signed   By: Simonne Come M.D.   On: 12/13/2013 07:59   Dg Chest Port 1 View  12/10/2013   CLINICAL DATA:  Short of breath on congestion  EXAM: PORTABLE CHEST - 1 VIEW  COMPARISON:  None.  FINDINGS: Cardiac silhouette is mildly enlarged. There is perihilar a fine interstitial pattern suggesting interstitial edema. No focal consolidation. No pneumothorax.  IMPRESSION: Cardiomegaly and mild interstitial edema pattern   Electronically Signed   By: Genevive Bi M.D.   On: 12/10/2013 21:45   Dg Chest Port 1 View  11/21/2013   CLINICAL DATA:  Dyspnea  EXAM: PORTABLE CHEST - 1 VIEW  COMPARISON:  DG CHEST 1V PORT dated 11/19/2013  FINDINGS: The lungs are adequately inflated. The interstitial markings have become less conspicuous since yesterday's study. The retrocardiac region on the left remains dense. The cardiopericardial silhouette remains mildly enlarged. The pulmonary vascularity is less prominent. The left internal jugular venous catheter tip lies in the region of the proximal SVC. The enteric feeding tube tip projects off the inferior margin of the image.  IMPRESSION: There has been improvement in the appearance of the pulmonary interstitium since yesterday's study consistent with  resolving pulmonary edema. There is persistent atelectasis or pneumonia in the left lower lobe. There is no pneumothorax.  These results will be called to the ordering clinician or representative by the Radiologist Assistant, and communication documented in the PACS Dashboard.   Electronically Signed   By: David  [place]   On: 11/21/2013 13:21   Dg Chest Port 1 View  11/19/2013   CLINICAL DATA:  Airspace disease, followup  EXAM: PORTABLE CHEST - 1 VIEW airspace disease, followup, history hypertension, diabetes  COMPARISON:  Portable exam 0537 hr compared to 11/18/2013  FINDINGS: Left jugular line stable tip projecting over brachiocephalic vein confluence with SVC.  Enlargement of cardiac silhouette.  Pulmonary vascular congestion.  Diffuse bilateral pulmonary infiltrates favor pulmonary edema over infection, unchanged.  No gross pleural effusion or pneumothorax.  IMPRESSION: Persistent pulmonary infiltrates question pulmonary edema, little changed.   Electronically Signed   By: Ulyses Southward M.D.   On: 11/19/2013 07:53   Dg Chest Port 1 View  11/18/2013   CLINICAL DATA:  Central line placement.  EXAM: PORTABLE CHEST - 1 VIEW  COMPARISON:  11/18/2013, 1051 hr  FINDINGS: Film at 1728 hr demonstrates placement of a left jugular central line with the tip transversely oriented at the brachiocephalic venous confluence/origin of SVC. No pneumothorax is identified. Lungs again demonstrate edema. The heart is enlarged.  IMPRESSION: Central line tip at the brachiocephalic venous confluence. No pneumothorax is identified.   Electronically Signed   By: Irish Lack M.D.   On: 11/18/2013 17:57   Dg Chest Portable 1 View  11/18/2013   CLINICAL DATA:  Weakness and altered mental status.  EXAM: PORTABLE CHEST - 1 VIEW  COMPARISON:  None.  FINDINGS: The lungs are borderline hypoinflated. The interstitial markings are increased bilaterally and are nearly confluent in some areas on the left. No air bronchograms are  demonstrated. The cardiac silhouette is enlarged. The pulmonary vascularity is engorged and indistinct. There is no pleural effusion or pneumothorax. The observed portions of the bony thorax appear  normal.  IMPRESSION: The findings suggest congestive heart failure with pulmonary interstitial and early alveolar edema.   Electronically Signed   By: David  [place]   On: 11/18/2013 11:22   Dg Abd Portable 1v  11/23/2013   CLINICAL DATA:  Bedside feeding tube placement.  EXAM: PORTABLE ABDOMEN - 1 VIEW  COMPARISON:  DG ABD PORTABLE 1V dated 11/21/2013  FINDINGS: Motion blurs the image, though I can tell that the feeding tube tip is projecting at the expected location of the gastric fundus. Visualized bowel gas pattern unremarkable.  IMPRESSION: Feeding tube tip in the fundus of the stomach.   Electronically Signed   By: Hulan Saas M.D.   On: 11/23/2013 18:51   Dg Abd Portable 1v  11/21/2013   CLINICAL DATA:  Feeding tube  EXAM: PORTABLE ABDOMEN - 1 VIEW  COMPARISON:  None.  FINDINGS: Feeding tube identified looped within the stomach with tip in the region of the cardia of the stomach. Nonobstructive gas pattern.  IMPRESSION: Feeding tube looped within the stomach.   Electronically Signed   By: Esperanza Heir M.D.   On: 11/21/2013 13:23    Jeoffrey Massed, MD  [facility] Pager:[phone]  If 7PM-7AM, please contact night-coverage [email or web address] Password [email or web address] 12/19/2013, 10:04 AM   LOS: 9 days

## 2013-12-19 NOTE — Clinical Social Work Note (Signed)
Per MD patient is ready to DC to [facility]. RN given number for report. CSW has requested ambulance transport for patient. DC summary faxed to facility. DC summary, DNR form, Most Form, and EMS form placed in packet on chart. [name] has been notified of DC and will update family. CSW signing off.  Roddie Mc, [place], [place], [phone]

## 2013-12-19 NOTE — Progress Notes (Signed)
[facility] to be D/C'd [facility] per MD order.  Discussed with the patient and all questions fully answered.    Medication List    STOP taking these medications       carvedilol 3.125 MG tablet  Commonly known as:  COREG     irbesartan 75 MG tablet  Commonly known as:  AVAPRO     simvastatin 10 MG tablet  Commonly known as:  ZOCOR     XARELTO 20 MG Tabs tablet  Generic drug:  Rivaroxaban      TAKE these medications       antiseptic oral rinse Liqd  15 mLs by Mouth Rinse route 2 times daily at 12 noon and 4 pm.     chlorhexidine 0.12 % solution  Commonly known as:  PERIDEX  15 mLs by Mouth Rinse route 2 (two) times daily.     diltiazem 10 mg/ml oral suspension  Commonly known as:  CARDIZEM  Take 90 mg by mouth every 6 (six) hours.     feeding supplement (OSMOLITE 1.5 CAL) Liqd  Place 1,000 mLs into feeding tube continuous.     free water Soln  Place 100 mLs into feeding tube as needed (meds or general tube care).     LORazepam 1 MG tablet  Commonly known as:  ATIVAN  Place 1 tablet (1 mg total) into feeding tube every 4 (four) hours as needed for anxiety.     magnesium hydroxide 400 MG/5ML suspension  Commonly known as:  MILK OF MAGNESIA  Take 30 mLs by mouth daily as needed for mild constipation.     metoprolol tartrate 25 mg/10 mL Susp  Commonly known as:  LOPRESSOR  Place 20 mLs (50 mg total) into feeding tube 2 (two) times daily.     oxyCODONE 5 MG/5ML solution  Commonly known as:  ROXICODONE  Place 5-10 mLs (5-10 mg total) into feeding tube every 2 (two) hours as needed for severe pain.     sorbitol 70 % Soln  Place 30 mLs into feeding tube daily as needed for moderate constipation.        VVS, Skin clean, dry and intact without evidence of skin break down, no evidence of skin tears noted. IV catheter discontinued intact. Site without signs and symptoms of complications. Dressing and pressure applied.  An After Visit Summary was printed and  given to the patient.  D/c education completed with patient/family including follow up instructions, medication list, d/c activities limitations if indicated, with other d/c instructions as indicated by MD - patient able to verbalize understanding, all questions fully answered.   Patient instructed to return to ED, call 911, or call MD for any changes in condition.   Patient escorted via stretcher, and D/C  [facility] via EMS.  Beckey Downing F 12/19/2013 11:09 AM

## 2013-12-19 NOTE — Progress Notes (Signed)
Report given to Heather at [facility].  

## 2013-12-24 NOTE — Consult Note (Signed)
I have reviewed this case with our NP and agree with the Assessment and Plan as stated.  Aniello Christopoulos L. Billey Wojciak, MD MBA [facility] Team at [facility] Team Phone: [phone] Pager: [phone]   

## 2014-01-22 DEATH — deceased

## 2015-05-10 IMAGING — CT CT ANGIO NECK
1 of 11 series · 4 of 33 positions shown · IV contrast (omnipaque)
Comparison: CT of the head November 18, 2013 at 9757 pm

CLINICAL DATA: Stroke could, followup.

EXAM:
CT ANGIOGRAPHY HEAD AND NECK
TECHNIQUE: Multidetector CT imaging of the head and neck was performed using
the standard protocol during bolus administration of intravenous
contrast. Multiplanar CT image reconstructions including MIPs were
obtained to evaluate the vascular anatomy. Carotid stenosis
measurements (when applicable) are obtained utilizing NASCET
criteria, using the distal internal carotid diameter as the
denominator.
CONTRAST:  80mL OMNIPAQUE IOHEXOL 350 MG/ML SOLN

[1 x 1 axial · axial · 0.54mm/px · z∈[+114,+324]mm · 4 of 351 slices shown]
[im 71/351  soft-tissue]
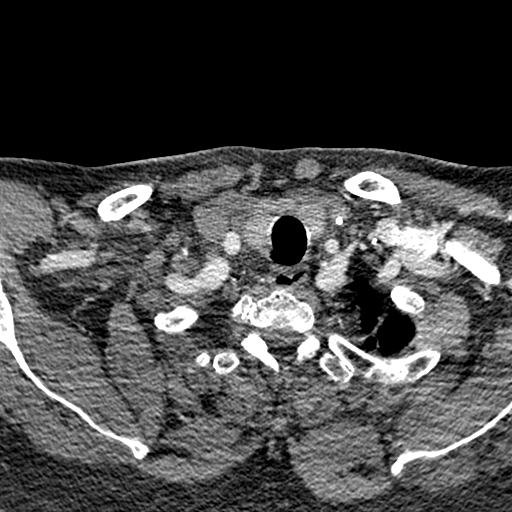
[im 141/351  bone]
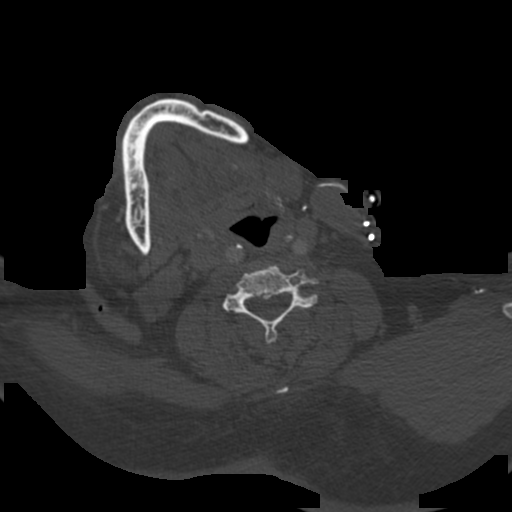
[im 211/351  soft-tissue]
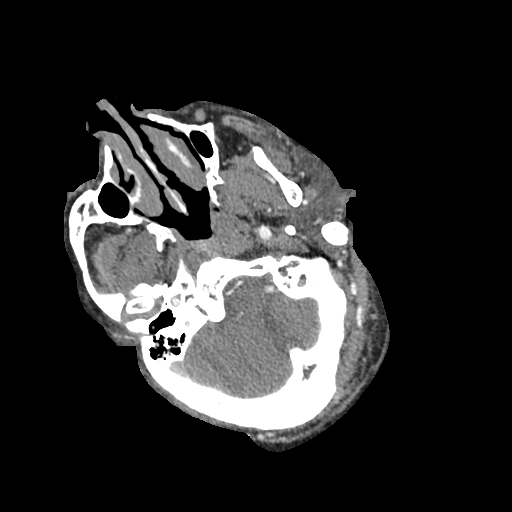
[im 281/351  bone]
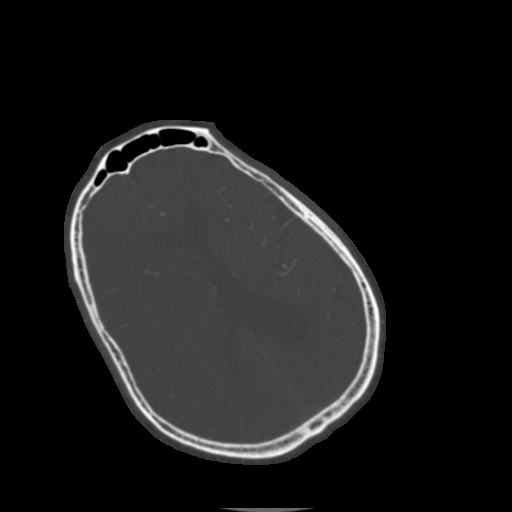

[4 of 33 positions shown; findings below may reference images not displayed]

FINDINGS: CTA HEAD FINDINGS

Anterior circulation: The petrous, cavernous and supra clinoid
internal carotid arteries appear widely patent with moderate
calcific atherosclerosis. Poor visualization of the A1 segments
bilaterally. Robust left M1 segment, however there is poor
visualization of the right M1 segment, with hazy appearance, which
may be accentuated by venous contamination. There is overall
attenuated appearance of the right M2 and M3 branches. There is
suspected filling defect of the carotid terminus propagating to the
proximal right M1 segment, as well as the right A1 segment (coronal
114/229). Patent anterior communicating artery with robust
appearance of the bilateral anterior cerebral arteries and more
distal branches.

Posterior circulation: Normal appearance of the vertebrobasilar
junction, however the left vertebral artery appears to predominately
terminate in the left posterior inferior cerebellar artery. Robust
bilateral posterior communicating arteries with normal appearance of
posterior cerebral arteries.

No aneurysm.  No suspicious luminal irregularity.

Large right middle cerebral artery territory infarct again noted,
with 5 mm of right to left subfalcine herniation, increased.
Increasing enlargement of the left ventricle atrium concerning for
entrapment.

Review of the MIP images confirms the above findings.

CTA NECK FINDINGS

Normal 3 vessel aortic arch with minimal calcific atherosclerosis of
the left subclavian artery origin. Widely patent origin of bilateral
common carotid arteries. Bilateral common carotid arteries course in
a straight line fashion, with up to 2 mm of eccentric calcific
atherosclerosis of the carotid bifurcations/ internal carotid artery
origins without hemodynamically significant stenosis. Medialized
course of the cervical internal carotid arteries, may be transient.
Right tonsillar loop, with tortuous course, to a lesser extent on
the left. The bilateral cervical internal carotid arteries are
widely patent.

Due to streak artifact from retained contrast in the low left
subclavian vein, limited evaluation of origin of left vertebral
artery. Diminutive right vertebral artery suggest congenital
variant, with dominant left vertebral artery. Bilateral vertebral
arteries are normal course and caliber, the left vertebral artery
predominately terminates in the left posterior-inferior cerebellar
artery.

Left internal jugular central venous catheter with distal tip at
brachiocephalic confluence. Included view of the chest demonstrates
apparent mediastinal lymphadenopathy, partially characterized.
Airways patent. Patient is edentulous. Degenerative change of hte
cervical spine, incompletely characterized.

Review of the MIP images confirms the above findings.
IMPRESSION: CTA head: Poor visualization of the right carotid terminus
concerning for embolus propagating into the proximal right M1 and
right A1 segments, however there is reconstitution distally
suggesting nonocclusive embolus or early re- cannulization. Mildly
attenuated right M2 and M3 appearance which may be in part due to
compressive changes from right cerebral edema and evolving right MCA
territory infarct. Worsening right cerebral edema, increasing right
to left subfalcine herniation.

Complete circle of Willis.

CTA neck:  No hemodynamically significant stenosis.

Suspected mediastinal lymphadenopathy, partially imaged.

Preliminary findings discussed by Dr. Nieda and confirmed by Dr.
Kankaiah on November 18, 2013 at approximately 5522 hr.

  By: San Page

## 2015-05-15 IMAGING — CR DG ABD PORTABLE 1V
1 series · 1 of 1 positions shown · non-contrast
Comparison: DG ABD PORTABLE 1V dated 11/21/2013

CLINICAL DATA: Bedside feeding tube placement.

EXAM:
PORTABLE ABDOMEN - 1 VIEW

[AP]
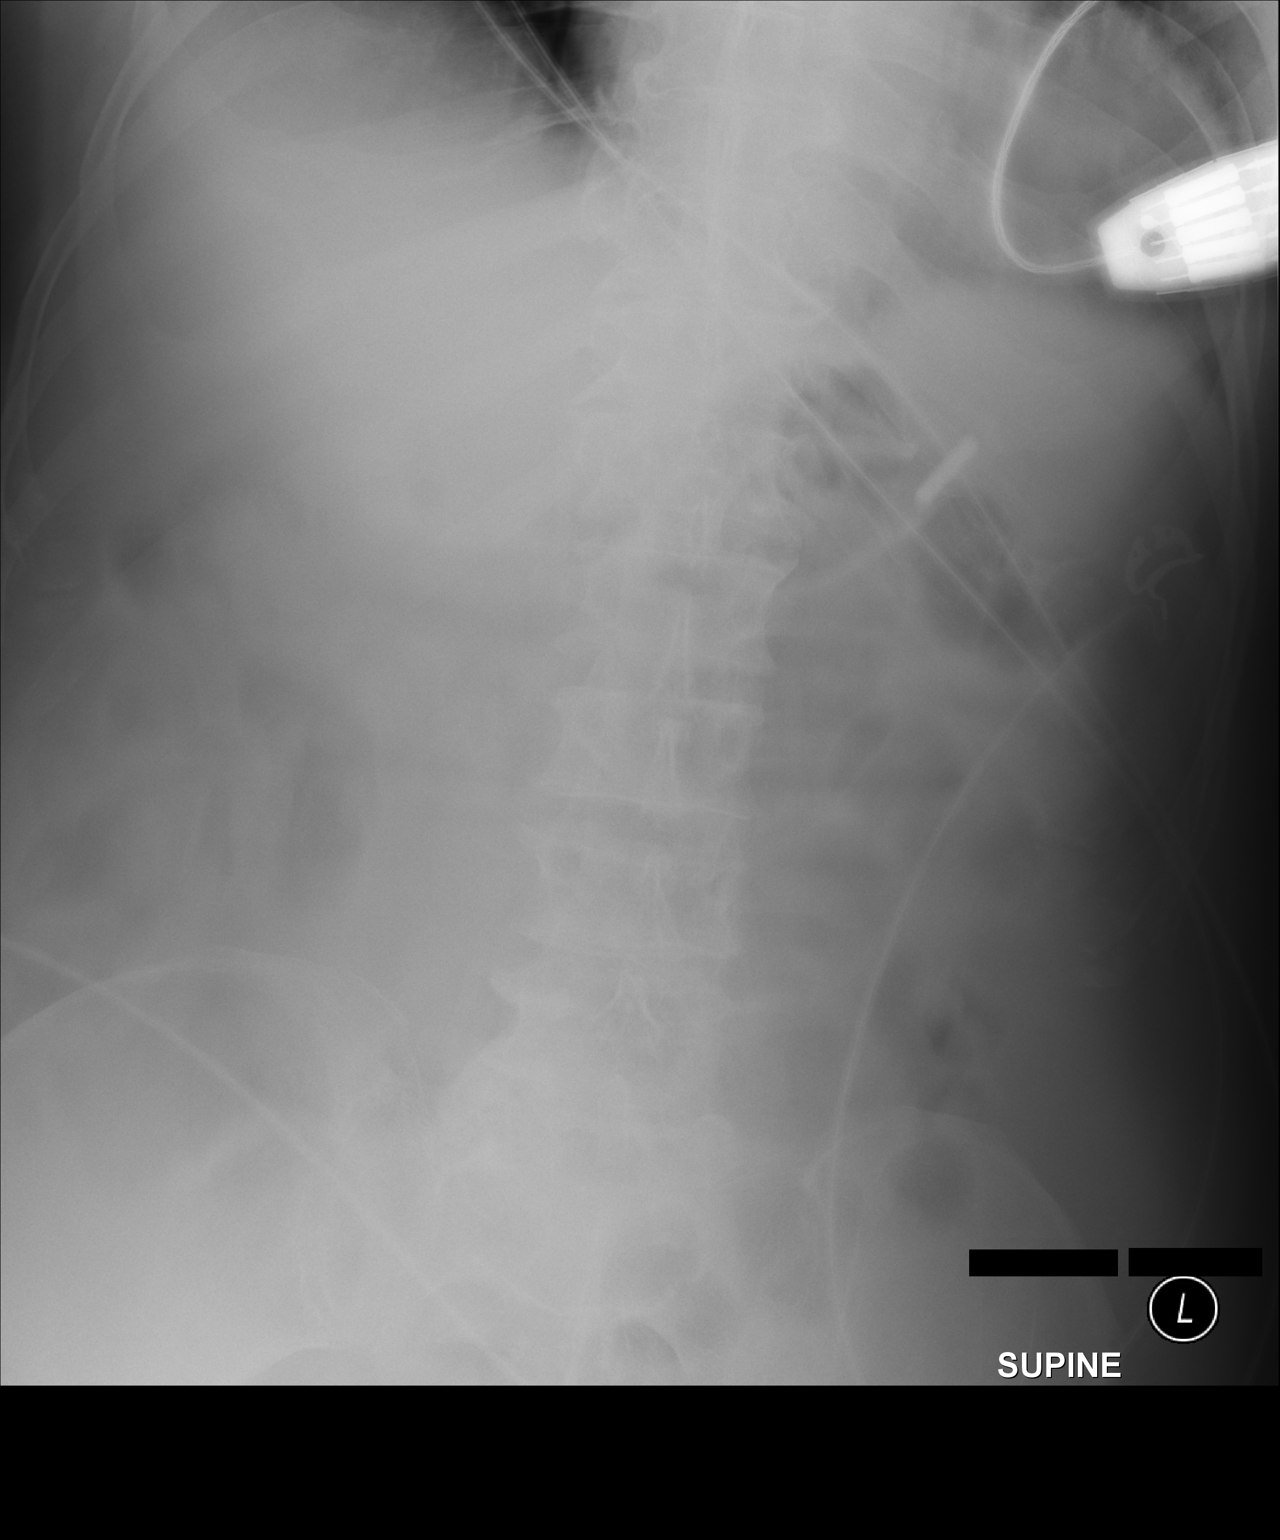

[1 of 1 positions shown; findings below may reference images not displayed]

FINDINGS: Motion blurs the image, though I can tell that the feeding tube tip
is projecting at the expected location of the gastric fundus.
Visualized bowel gas pattern unremarkable.
IMPRESSION: Feeding tube tip in the fundus of the stomach.

## 2019-10-16 NOTE — Telephone Encounter (Signed)
completed
